# Patient Record
Sex: Female | Born: 1975 | Race: Black or African American | Hispanic: No | Marital: Single | Smoking: Never smoker
Health system: Southern US, Community
[De-identification: ages and names within clinical notes are randomized; demographics above are authoritative.]

## PROBLEM LIST (undated history)

## (undated) DIAGNOSIS — J45909 Unspecified asthma, uncomplicated: Secondary | ICD-10-CM

## (undated) DIAGNOSIS — R079 Chest pain, unspecified: Secondary | ICD-10-CM

## (undated) DIAGNOSIS — R12 Heartburn: Secondary | ICD-10-CM

## (undated) DIAGNOSIS — G43909 Migraine, unspecified, not intractable, without status migrainosus: Secondary | ICD-10-CM

## (undated) DIAGNOSIS — IMO0001 Reserved for inherently not codable concepts without codable children: Secondary | ICD-10-CM

## (undated) HISTORY — PX: BREAST EXCISIONAL BIOPSY: SUR124

## (undated) HISTORY — PX: BREAST LUMPECTOMY: SHX2

---

## 1997-10-19 ENCOUNTER — Other Ambulatory Visit: Admission: RE | Admit: 1997-10-19 | Discharge: 1997-10-19 | Payer: Self-pay | Admitting: Obstetrics

## 1997-12-22 ENCOUNTER — Ambulatory Visit (HOSPITAL_COMMUNITY): Admission: RE | Admit: 1997-12-22 | Discharge: 1997-12-22 | Payer: Self-pay | Admitting: Obstetrics

## 1997-12-24 ENCOUNTER — Ambulatory Visit (HOSPITAL_COMMUNITY): Admission: RE | Admit: 1997-12-24 | Discharge: 1997-12-24 | Payer: Self-pay | Admitting: Obstetrics

## 1998-03-26 ENCOUNTER — Inpatient Hospital Stay (HOSPITAL_COMMUNITY): Admission: AD | Admit: 1998-03-26 | Discharge: 1998-03-26 | Payer: Self-pay | Admitting: Obstetrics

## 1998-04-08 ENCOUNTER — Inpatient Hospital Stay (HOSPITAL_COMMUNITY): Admission: AD | Admit: 1998-04-08 | Discharge: 1998-04-08 | Payer: Self-pay | Admitting: Obstetrics

## 1998-04-12 ENCOUNTER — Inpatient Hospital Stay (HOSPITAL_COMMUNITY): Admission: AD | Admit: 1998-04-12 | Discharge: 1998-04-15 | Payer: Self-pay | Admitting: Obstetrics

## 1998-04-12 ENCOUNTER — Inpatient Hospital Stay (HOSPITAL_COMMUNITY): Admission: AD | Admit: 1998-04-12 | Discharge: 1998-04-13 | Payer: Self-pay | Admitting: Obstetrics

## 1999-07-11 ENCOUNTER — Other Ambulatory Visit: Admission: RE | Admit: 1999-07-11 | Discharge: 1999-07-11 | Payer: Self-pay | Admitting: Obstetrics

## 1999-12-18 ENCOUNTER — Encounter: Payer: Self-pay | Admitting: Emergency Medicine

## 1999-12-18 ENCOUNTER — Emergency Department (HOSPITAL_COMMUNITY): Admission: EM | Admit: 1999-12-18 | Discharge: 1999-12-18 | Payer: Self-pay | Admitting: Emergency Medicine

## 2000-03-01 ENCOUNTER — Other Ambulatory Visit: Admission: RE | Admit: 2000-03-01 | Discharge: 2000-03-01 | Payer: Self-pay | Admitting: Obstetrics

## 2000-05-06 ENCOUNTER — Inpatient Hospital Stay (HOSPITAL_COMMUNITY): Admission: AD | Admit: 2000-05-06 | Discharge: 2000-05-08 | Payer: Self-pay | Admitting: Obstetrics

## 2001-01-22 ENCOUNTER — Emergency Department (HOSPITAL_COMMUNITY): Admission: EM | Admit: 2001-01-22 | Discharge: 2001-01-22 | Payer: Self-pay | Admitting: Emergency Medicine

## 2001-01-22 ENCOUNTER — Encounter: Payer: Self-pay | Admitting: Emergency Medicine

## 2001-04-04 ENCOUNTER — Emergency Department (HOSPITAL_COMMUNITY): Admission: EM | Admit: 2001-04-04 | Discharge: 2001-04-04 | Payer: Self-pay | Admitting: Emergency Medicine

## 2003-12-23 ENCOUNTER — Emergency Department (HOSPITAL_COMMUNITY): Admission: EM | Admit: 2003-12-23 | Discharge: 2003-12-23 | Payer: Self-pay | Admitting: Family Medicine

## 2005-04-12 ENCOUNTER — Emergency Department (HOSPITAL_COMMUNITY): Admission: EM | Admit: 2005-04-12 | Discharge: 2005-04-12 | Payer: Self-pay | Admitting: Emergency Medicine

## 2006-01-09 ENCOUNTER — Emergency Department (HOSPITAL_COMMUNITY): Admission: EM | Admit: 2006-01-09 | Discharge: 2006-01-09 | Payer: Self-pay | Admitting: Family Medicine

## 2006-09-18 ENCOUNTER — Inpatient Hospital Stay (HOSPITAL_COMMUNITY): Admission: AD | Admit: 2006-09-18 | Discharge: 2006-09-20 | Payer: Self-pay | Admitting: Obstetrics

## 2007-12-10 ENCOUNTER — Emergency Department (HOSPITAL_COMMUNITY): Admission: EM | Admit: 2007-12-10 | Discharge: 2007-12-10 | Payer: Self-pay | Admitting: Emergency Medicine

## 2009-03-03 ENCOUNTER — Emergency Department (HOSPITAL_COMMUNITY): Admission: EM | Admit: 2009-03-03 | Discharge: 2009-03-03 | Payer: Self-pay | Admitting: Family Medicine

## 2009-04-03 ENCOUNTER — Ambulatory Visit (HOSPITAL_COMMUNITY): Admission: RE | Admit: 2009-04-03 | Discharge: 2009-04-03 | Payer: Self-pay | Admitting: Orthopedic Surgery

## 2009-04-27 ENCOUNTER — Encounter: Admission: RE | Admit: 2009-04-27 | Discharge: 2009-05-27 | Payer: Self-pay | Admitting: Orthopedic Surgery

## 2010-05-12 LAB — BASIC METABOLIC PANEL
BUN: 8 mg/dL (ref 6–23)
CO2: 29 mEq/L (ref 19–32)
Calcium: 8.7 mg/dL (ref 8.4–10.5)
Chloride: 105 mEq/L (ref 96–112)
Creatinine, Ser: 0.75 mg/dL (ref 0.4–1.2)
GFR calc Af Amer: >60 mL/min (ref >60)
GFR calc non Af Amer: >60 mL/min (ref >60)
Glucose, Bld: 89 mg/dL (ref 70–99)
Potassium: 3.8 mEq/L (ref 3.5–5.1)
Sodium: 139 mEq/L (ref 135–145)

## 2010-05-12 LAB — CBC
HCT: 35.7 % — ABNORMAL LOW (ref 36.0–46.0)
Hemoglobin: 12.4 g/dL (ref 12.0–15.0)
MCHC: 34.8 g/dL (ref 30.0–36.0)
MCV: 90.1 fL (ref 78.0–100.0)
Platelets: 308 10*3/uL (ref 150–400)
RBC: 3.96 MIL/uL (ref 3.87–5.11)
RDW: 11.9 % (ref 11.5–15.5)
WBC: 7.4 10*3/uL (ref 4.0–10.5)

## 2010-07-08 NOTE — Discharge Summary (Signed)
NAMETAELAR, GRONEWOLD                 ACCOUNT NO.:  [id number]   MEDICAL RECORD NO.:  [id number]          PATIENT TYPE:  INP   LOCATION:  9309                          FACILITY:  [facility]   PHYSICIAN:  Kathreen Cosier, M.D.DATE OF BIRTH:  1975-03-24   DATE OF ADMISSION:  09/18/2006  DATE OF DISCHARGE:  09/20/2006                               DISCHARGE SUMMARY   HISTORY OF PRESENT ILLNESS:  The patient is a 35 year old gravida 3,  para 3, last menstrual period 2 weeks prior to admission.  She  complained of left flank pain which became worse over the past 2 weeks.   HOSPITAL COURSE:  On admission her hemoglobin was 12.4, white count  14.9, sodium 133, potassium 3.1, chloride 101, glucose 128.  She had a  positive urine and left flank pain and she was treated with IV  ampicillin, gentamicin by IV.  The day after admission she felt much  better.   DISCHARGE MEDICATIONS:  She became afebrile and by 09/20/2006 she was  discharged home on ampicillin 500, 1 every 6 hours for 10 days and  Motrin 800 t.i.d..   FOLLOW UP:  She is to see me in 2 weeks.   DISCHARGE DIAGNOSIS:  Status post urinary tract infection.           ______________________________  Kathreen Cosier, M.D.     BAM/MEDQ  D:  10/17/2006  T:  10/17/2006  Job:  [id number]

## 2010-07-08 NOTE — Op Note (Signed)
[facility]  Patient:    Erin Morton, Erin Morton                          MRN: [id number] Proc. Date: 05/07/00 Adm. Date:  [id number] Attending:  Venita Sheffield                           Operative Report  PREOPERATIVE DIAGNOSIS:       Multiparity.  POSTOPERATIVE DIAGNOSIS:  OPERATION:                    Postpartum tubal.  SURGEON:                      Kathreen Cosier, M.D.  ASSISTANT:  ANESTHESIA:                   Epidural.  ESTIMATED BLOOD LOSS:  DESCRIPTION OF PROCEDURE:     With the patient in the supine position, the abdomen was prepped and draped and the bladder emptied with a straight catheter.  A midline subumbilical incision was made one inch long and carried down to the fascia.  The fascia was cleaned and grasped with two Kochers.  The fascia and peritoneum were opened with Mayo scissors.  The left tube grasped in the midportion with a Babcock clamp.  A 0 plain suture placed in the mesosalpinx below the portion of the tube within the clamp and this was tied on both sides with approximately one inch of tube transected.  The procedure was done in a similar fashion on the other side.  The abdomen was closed in layers.  The peritoneum and fascia with continuous suture of 0 Dexon and the skin closed with subcuticular stitch of 3-0 plain.  The patient tolerated the procedure well and taken to the recovery room in good condition. DD:  05/07/00 TD:  05/07/00 Job: [id number]

## 2010-12-05 LAB — COMPREHENSIVE METABOLIC PANEL
Albumin: 3.5
BUN: 4 — ABNORMAL LOW
Creatinine, Ser: 0.75
Total Bilirubin: 0.9
Total Protein: 7.1

## 2010-12-05 LAB — CBC
HCT: 35.6 — ABNORMAL LOW
MCV: 87.5
Platelets: 275
RDW: 11.9

## 2010-12-05 LAB — WET PREP, GENITAL
Clue Cells Wet Prep HPF POC: NONE SEEN
Trich, Wet Prep: NONE SEEN

## 2010-12-05 LAB — URINALYSIS, ROUTINE W REFLEX MICROSCOPIC
Nitrite: NEGATIVE
Specific Gravity, Urine: 1.01
Urobilinogen, UA: 1

## 2010-12-05 LAB — GC/CHLAMYDIA PROBE AMP, GENITAL: Chlamydia, DNA Probe: NEGATIVE

## 2010-12-05 LAB — URINE MICROSCOPIC-ADD ON

## 2011-02-15 ENCOUNTER — Encounter: Payer: Self-pay | Admitting: Emergency Medicine

## 2011-02-15 ENCOUNTER — Emergency Department (HOSPITAL_COMMUNITY)
Admission: EM | Admit: 2011-02-15 | Discharge: 2011-02-15 | Disposition: A | Discharge status: Home / Self Care | Payer: Self-pay | Attending: Emergency Medicine | Admitting: Emergency Medicine

## 2011-02-15 DIAGNOSIS — N898 Other specified noninflammatory disorders of vagina: Secondary | ICD-10-CM | POA: Insufficient documentation

## 2011-02-15 DIAGNOSIS — X58XXXA Exposure to other specified factors, initial encounter: Secondary | ICD-10-CM | POA: Insufficient documentation

## 2011-02-15 DIAGNOSIS — S3141XA Laceration without foreign body of vagina and vulva, initial encounter: Secondary | ICD-10-CM

## 2011-02-15 DIAGNOSIS — S3140XA Unspecified open wound of vagina and vulva, initial encounter: Secondary | ICD-10-CM | POA: Insufficient documentation

## 2011-02-15 LAB — WET PREP, GENITAL: Yeast Wet Prep HPF POC: NONE SEEN

## 2011-02-15 MED ORDER — LIDOCAINE 5 % EX OINT: TOPICAL_OINTMENT | CUTANEOUS | Status: AC | PRN

## 2011-02-15 MED ORDER — LIDOCAINE HCL 2 % EX GEL
CUTANEOUS | Status: AC
  Administered 2011-02-15: 15:00:00 via TOPICAL
  Filled 2011-02-15: qty 10

## 2011-02-15 MED ORDER — LIDOCAINE 4 % EX CREA
TOPICAL_CREAM | Freq: Once | CUTANEOUS | Status: DC
  Filled 2011-02-15: qty 5

## 2011-02-15 NOTE — ED Notes (Signed)
Pt. Complaint of vaginal discomfort stated Sunday. With vaginal discharges , denies pain upon urination. Denies lower back pain and nausea and vomiting.

## 2011-02-15 NOTE — ED Provider Notes (Signed)
History     CSN: [id number]  Arrival date & time 02/15/11  1213   First MD Initiated Contact with Patient 02/15/11 1313      No chief complaint on file.   (Consider location/radiation/quality/duration/timing/severity/associated sxs/prior treatment) The history is provided by the patient.   the patient is a 35 year old female that complains of vaginal discomfort.  Since she had intercourse 2 nights ago.  She says it feels like something is torn in her vaginal area.  She denies any bleeding.  She has a slight discharge that she says is not unusual for her.  She has not had fevers, chills, nausea or vomiting.  She has had a history of a sexually transmitted disease for many years ago, and has not had any complications from it.  She states that when she does urinate.  There is some burning when the urine goes over the vaginal area that seems to be performed.  No past medical history on file.  No past surgical history on file.  No family history on file.  History  Substance Use Topics  . Smoking status: Never Smoker   . Smokeless tobacco: Not on file  . Alcohol Use: Yes     occasional    OB History    Grav Para Term Preterm Abortions TAB SAB Ect Mult Living                  Review of Systems  Constitutional: Negative for fever and chills.  HENT: Negative for congestion.   Eyes: Negative for redness.  Cardiovascular: Positive for chest pain.  Gastrointestinal: Negative for abdominal pain.  Genitourinary: Positive for dysuria, vaginal discharge and vaginal pain. Negative for vaginal bleeding.       Slight vaginal discharge, which she says is not unusual for  Musculoskeletal: Negative for back pain.  Skin: Negative for rash.  Neurological: Negative for headaches.  Psychiatric/Behavioral: Negative for confusion.    Allergies  Review of patient's allergies indicates no known allergies.  Home Medications  No current outpatient prescriptions on file.  BP 139/83  Pulse  102  Temp(Src) 98.5 F (36.9 C) (Oral)  Resp 18  SpO2 99%  LMP 02/01/2011  Physical Exam  Constitutional: She is oriented to person, place, and time. She appears well-developed and well-nourished.  HENT:  Head: Normocephalic and atraumatic.  Eyes: Pupils are equal, round, and reactive to light.  Neck: Normal range of motion.  Cardiovascular: Normal rate, regular rhythm and normal heart sounds.   No murmur heard. Pulmonary/Chest: Effort normal and breath sounds normal. No respiratory distress. She has no wheezes. She has no rales.  Abdominal: Soft. She exhibits no distension and no mass. There is no tenderness. There is no rebound and no guarding.  Genitourinary: Uterus normal. Vaginal discharge found.       Pelvic exam performed with female chaperone Approximately 1 cm, vaginal tear between the posterior vagina, and anus.  Very small amount of clear vaginal discharge.  No adnexal tenderness or masses.  No rashes or ulcerations.  Musculoskeletal: Normal range of motion. She exhibits no edema and no tenderness.  Neurological: She is alert and oriented to person, place, and time. No cranial nerve deficit.  Skin: Skin is warm and dry. No rash noted. No erythema.  Psychiatric: She has a normal mood and affect. Her behavior is normal.    ED Course  Procedures (including critical care time)  Labs Reviewed - No data to display No results found.   No diagnosis found.  MDM  Vaginal tear after intercourse No signs std         Nicholes Stairs, MD 02/15/11 1446

## 2011-05-04 ENCOUNTER — Ambulatory Visit: Payer: Self-pay | Admitting: Family Medicine

## 2011-05-09 ENCOUNTER — Ambulatory Visit: Payer: Self-pay | Admitting: Family Medicine

## 2013-05-09 ENCOUNTER — Ambulatory Visit (INDEPENDENT_AMBULATORY_CARE_PROVIDER_SITE_OTHER): Payer: 59 | Admitting: Physician Assistant

## 2013-05-09 VITALS — BP 130/75 | HR 89 | Temp 98.5°F | Resp 18 | Ht 68.0 in | Wt 186.0 lb

## 2013-05-09 DIAGNOSIS — A5901 Trichomonal vulvovaginitis: Secondary | ICD-10-CM

## 2013-05-09 DIAGNOSIS — N898 Other specified noninflammatory disorders of vagina: Secondary | ICD-10-CM

## 2013-05-09 DIAGNOSIS — Z113 Encounter for screening for infections with a predominantly sexual mode of transmission: Secondary | ICD-10-CM

## 2013-05-09 LAB — POCT WET PREP WITH KOH
Clue Cells Wet Prep HPF POC: NEGATIVE
KOH Prep POC: NEGATIVE
Trichomonas, UA: POSITIVE
Yeast Wet Prep HPF POC: NEGATIVE

## 2013-05-09 MED ORDER — METRONIDAZOLE 500 MG PO TABS: 2000.0000 mg | ORAL_TABLET | Freq: Once | ORAL | Status: DC

## 2013-05-09 NOTE — Progress Notes (Signed)
   Subjective:    Patient ID: Erin Morton, female    DOB: February 18, 1976, 38 y.o.   MRN: [id number]  HPI 38 year old female presents for evaluation of vaginal discharge x 3 days.  States symptoms are progressively worsening. Describes as a watery discharge associated with vaginal irritation and pruritis.   Denies abdominal pain, nausea, vomiting, fever, chills, dysuria, urinary frequency, bleeding or spotting.  She is sexually active with 1 female partner. No specific concern about STI's but would like to be tested today.  No personal hx of STI's.  LNMP 05/01/13 Patient is otherwise doing well with no other concerns today.     Review of Systems  Constitutional: Negative for fever and chills.  Gastrointestinal: Negative for nausea, vomiting and abdominal pain.  Genitourinary: Positive for vaginal discharge. Negative for dysuria, frequency, vaginal bleeding and pelvic pain.       Objective:   Physical Exam  Constitutional: She is oriented to person, place, and time. She appears well-developed and well-nourished.  HENT:  Head: Normocephalic and atraumatic.  Right Ear: External ear normal.  Left Ear: External ear normal.  Eyes: Conjunctivae are normal.  Neck: Normal range of motion.  Cardiovascular: Normal rate.   Pulmonary/Chest: Effort normal.  Abdominal: Soft. Bowel sounds are normal. There is no tenderness. There is no rebound and no guarding.  Genitourinary: Uterus normal. Pelvic exam was performed with patient supine. Cervix exhibits discharge (thick, yellow discharge). Cervix exhibits no motion tenderness. Right adnexum displays no mass, no tenderness and no fullness. Left adnexum displays no mass, no tenderness and no fullness. Vaginal discharge (there is thick, yellowish discharge at introitus) found.  Neurological: She is alert and oriented to person, place, and time.  Psychiatric: She has a normal mood and affect. Her behavior is normal. Judgment and thought content normal.       Results for orders placed in visit on 05/09/13  POCT WET PREP WITH KOH      Result Value Ref Range   Trichomonas, UA Positive     Clue Cells Wet Prep HPF POC neg     Epithelial Wet Prep HPF POC 1-3     Yeast Wet Prep HPF POC neg     Bacteria Wet Prep HPF POC 1+     RBC Wet Prep HPF POC 1-2     WBC Wet Prep HPF POC 15-20     KOH Prep POC Negative         Assessment & Plan:  Leukorrhea, not specified as infective - Plan: POCT Wet Prep with KOH  Screening for STD (sexually transmitted disease) - Plan: GC/Chlamydia Probe Amp, HIV antibody, RPR  Trichomonal vaginitis - Plan: metroNIDAZOLE (FLAGYL) 500 MG tablet  Discussed +trichomonas result with patient. Due to length of her relationship I think that it is unlikely both were asymptomatic carriers for 2 years. She will discuss these results with her partner and inform him to be tested as well. GC/CL, HIV, RPR are pending. Treated with Flagyl 2 grams po x 1 dose. Follow up here as needed.

## 2013-05-10 LAB — GC/CHLAMYDIA PROBE AMP
CT Probe RNA: NEGATIVE
GC Probe RNA: NEGATIVE

## 2013-05-10 LAB — RPR

## 2013-05-10 LAB — HIV ANTIBODY (ROUTINE TESTING W REFLEX): HIV: NONREACTIVE

## 2014-03-28 ENCOUNTER — Emergency Department (HOSPITAL_COMMUNITY): Payer: Self-pay

## 2014-03-28 ENCOUNTER — Encounter (HOSPITAL_COMMUNITY): Payer: Self-pay | Admitting: *Deleted

## 2014-03-28 ENCOUNTER — Emergency Department (HOSPITAL_COMMUNITY)
Admission: EM | Admit: 2014-03-28 | Discharge: 2014-03-28 | Disposition: A | Discharge status: Home / Self Care | Payer: Self-pay | Attending: Emergency Medicine | Admitting: Emergency Medicine

## 2014-03-28 DIAGNOSIS — Z3202 Encounter for pregnancy test, result negative: Secondary | ICD-10-CM | POA: Insufficient documentation

## 2014-03-28 DIAGNOSIS — N361 Urethral diverticulum: Secondary | ICD-10-CM | POA: Insufficient documentation

## 2014-03-28 LAB — URINALYSIS, ROUTINE W REFLEX MICROSCOPIC
GLUCOSE, UA: NEGATIVE mg/dL
KETONES UR: 15 mg/dL — AB
Nitrite: NEGATIVE
PH: 5.5 (ref 5.0–8.0)
Protein, ur: 100 mg/dL — AB
Specific Gravity, Urine: 1.025 (ref 1.005–1.030)
UROBILINOGEN UA: 1 mg/dL (ref 0.0–1.0)

## 2014-03-28 LAB — CBC WITH DIFFERENTIAL/PLATELET
Basophils Absolute: 0 10*3/uL (ref 0.0–0.1)
Basophils Relative: 0 % (ref 0–1)
Eosinophils Absolute: 0.2 10*3/uL (ref 0.0–0.7)
Eosinophils Relative: 3 % (ref 0–5)
HEMATOCRIT: 34 % — AB (ref 36.0–46.0)
HEMOGLOBIN: 11.2 g/dL — AB (ref 12.0–15.0)
Lymphocytes Relative: 16 % (ref 12–46)
Lymphs Abs: 1.4 10*3/uL (ref 0.7–4.0)
MCH: 28.3 pg (ref 26.0–34.0)
MCHC: 32.9 g/dL (ref 30.0–36.0)
MCV: 85.9 fL (ref 78.0–100.0)
Monocytes Absolute: 0.6 10*3/uL (ref 0.1–1.0)
Monocytes Relative: 6 % (ref 3–12)
Neutro Abs: 6.9 10*3/uL (ref 1.7–7.7)
Neutrophils Relative %: 75 % (ref 43–77)
Platelets: 360 10*3/uL (ref 150–400)
RBC: 3.96 MIL/uL (ref 3.87–5.11)
RDW: 12.9 % (ref 11.5–15.5)
WBC: 9.1 10*3/uL (ref 4.0–10.5)

## 2014-03-28 LAB — BASIC METABOLIC PANEL
Anion gap: 6 (ref 5–15)
BUN: 13 mg/dL (ref 6–23)
CHLORIDE: 105 mmol/L (ref 96–112)
CO2: 27 mmol/L (ref 19–32)
Calcium: 8.5 mg/dL (ref 8.4–10.5)
Creatinine, Ser: 0.74 mg/dL (ref 0.50–1.10)
GFR calc Af Amer: >90 mL/min (ref >90)
GFR calc non Af Amer: >90 mL/min (ref >90)
GLUCOSE: 99 mg/dL (ref 70–99)
Potassium: 3.8 mmol/L (ref 3.5–5.1)
SODIUM: 138 mmol/L (ref 135–145)

## 2014-03-28 LAB — PREGNANCY, URINE: PREG TEST UR: NEGATIVE

## 2014-03-28 LAB — URINE MICROSCOPIC-ADD ON

## 2014-03-28 MED ORDER — IOHEXOL 300 MG/ML  SOLN
100.0000 mL | Freq: Once | INTRAMUSCULAR | Status: AC | PRN
  Administered 2014-03-28: 100 mL via INTRAVENOUS

## 2014-03-28 NOTE — ED Provider Notes (Signed)
CSN: [id number]     Arrival date & time 03/28/14  1900 History   First MD Initiated Contact with Patient 03/28/14 1938     Chief Complaint  Patient presents with  . Vaginal mass      (Consider location/radiation/quality/duration/timing/severity/associated sxs/prior Treatment) HPI Comments: Patient presents with a vaginal knot. She states she's felt some fullness in her vaginal wall for about a week but today noticed a knot that's tender. She denies any difficulty urinating. She's currently on her period. She denies any abdominal pain. There is no nausea or vomiting. She denies any vaginal discharge other than the bleeding.   History reviewed. No pertinent past medical history. Past Surgical History  Procedure Laterality Date  . Breast lumpectomy      right breast, pt was 39 yo   History reviewed. No pertinent family history. History  Substance Use Topics  . Smoking status: Never Smoker   . Smokeless tobacco: Not on file  . Alcohol Use: Yes     Comment: occasional   OB History    Gravida Para Term Preterm AB TAB SAB Ectopic Multiple Living   3 3 3       3      Review of Systems  Constitutional: Negative for fever, chills, diaphoresis and fatigue.  HENT: Negative for congestion, rhinorrhea and sneezing.   Eyes: Negative.   Respiratory: Negative for cough, chest tightness and shortness of breath.   Cardiovascular: Negative for chest pain and leg swelling.  Gastrointestinal: Negative for nausea, vomiting, abdominal pain, diarrhea and blood in stool.  Genitourinary: Positive for vaginal bleeding and vaginal pain. Negative for frequency, hematuria, flank pain and difficulty urinating.  Musculoskeletal: Negative for back pain and arthralgias.  Skin: Negative for rash.  Neurological: Negative for dizziness, speech difficulty, weakness, numbness and headaches.      Allergies  Review of patient's allergies indicates no known allergies.  Home Medications   Prior to Admission  medications   Medication Sig Start Date End Date Taking? Authorizing Provider  metroNIDAZOLE (FLAGYL) 500 MG tablet Take 4 tablets (2,000 mg total) by mouth once. 05/09/13   Heather M Marte, PA-C   BP 113/65 mmHg  Pulse 79  Temp(Src) 98.9 F (37.2 C) (Oral)  Resp 18  Ht 5\' 7"  (1.702 m)  Wt 180 lb (81.647 kg)  BMI 28.19 kg/m2  SpO2 98%  LMP 03/25/2014 Physical Exam  Constitutional: She is oriented to person, place, and time. She appears well-developed and well-nourished.  HENT:  Head: Normocephalic and atraumatic.  Eyes: Pupils are equal, round, and reactive to light.  Neck: Normal range of motion. Neck supple.  Cardiovascular: Normal rate, regular rhythm and normal heart sounds.   Pulmonary/Chest: Effort normal and breath sounds normal. No respiratory distress. She has no wheezes. She has no rales. She exhibits no tenderness.  Abdominal: Soft. Bowel sounds are normal. There is no tenderness. There is no rebound and no guarding.  Genitourinary:  Patient has vaginal bleeding consistent with her normal menstrual cycle. She has a proximally 3 cm firm mass to her anterior vaginal wall around the urethral area. It is tender on palpation.  Musculoskeletal: Normal range of motion. She exhibits no edema.  Lymphadenopathy:    She has no cervical adenopathy.  Neurological: She is alert and oriented to person, place, and time.  Skin: Skin is warm and dry. No rash noted.  Psychiatric: She has a normal mood and affect.    ED Course  Procedures (including critical care time) Labs Review  Labs Reviewed  URINALYSIS, ROUTINE W REFLEX MICROSCOPIC - Abnormal; Notable for the following:    Color, Urine RED (*)    APPearance TURBID (*)    Hgb urine dipstick LARGE (*)    Bilirubin Urine SMALL (*)    Ketones, ur 15 (*)    Protein, ur 100 (*)    Leukocytes, UA SMALL (*)    All other components within normal limits  CBC WITH DIFFERENTIAL/PLATELET - Abnormal; Notable for the following:     Hemoglobin 11.2 (*)    HCT 34.0 (*)    All other components within normal limits  PREGNANCY, URINE  BASIC METABOLIC PANEL  URINE MICROSCOPIC-ADD ON    Imaging Review Ct Pelvis W Contrast  03/28/2014   CLINICAL DATA:  Vaginal mass.  EXAM: CT PELVIS WITH CONTRAST  TECHNIQUE: Multidetector CT imaging of the pelvis was performed using the standard protocol following the bolus administration of intravenous contrast.  CONTRAST:  163mL OMNIPAQUE IOHEXOL 300 MG/ML  SOLN  COMPARISON:  None.  FINDINGS: 25 x 16 mm rim enhancing cyst is noted in the lower pelvis. Given its location this could be a urethral diverticulum or paravaginal cyst such as a Gartner's duct cyst or Bartholin gland cyst. MRI may be helpful for further evaluation if clinically necessary.  Enlarged fibroid uterus is noted. Both ovaries are normal. The bladder is unremarkable. No pelvic adenopathy. No inguinal adenopathy.  IMPRESSION: 25 x 16 mm cystic structure in the lower pelvis could be a paravaginal cyst (Gartner's duct cyst or Bartholin gland cyst) or a urethral diverticulum.  Enlarged fibroid uterus.   Electronically Signed   By: Kalman Jewels M.D.   On: 03/28/2014 22:02     EKG Interpretation None      MDM   Final diagnoses:  Urethral diverticulum    Pt presents with a mass to her anterior vaginal wall. Given the CT findings, I feel is most consistent with a urethral diverticulum. I don't feel that it's a Bartholin's gland cyst. I discussed close follow-up with OB/GYN. I referred her to the women's outpatient center. I gave her return precautions to return if she has worsening pain fevers difficulty urinating or other worsening symptoms.   Malvin Johns, MD 03/29/14 [id number]

## 2014-03-28 NOTE — Discharge Instructions (Signed)
Return for any worsening pain, swelling, fevers, or difficulty urinating.

## 2014-03-28 NOTE — ED Notes (Signed)
Per pt report: Pt report feeling a "knot" on the internal right side of her vaginal wall.  Pt reports noticed the knot an hour ago but reports having pressure for the past 4 days.  Pt assumed pressure was coming from her menstrual cycle.  Pt reports her period was normal for her.  Pt denies any pain but does report pressure.  Pt reports last intercourse to be 02/20/14.  Pt a/o x 4.  Skin warm and dry. Ambulatory.

## 2014-04-22 ENCOUNTER — Ambulatory Visit (INDEPENDENT_AMBULATORY_CARE_PROVIDER_SITE_OTHER): Payer: Self-pay | Admitting: Obstetrics and Gynecology

## 2014-04-22 ENCOUNTER — Encounter: Payer: Self-pay | Admitting: Obstetrics and Gynecology

## 2014-04-22 VITALS — BP 121/75 | HR 84 | Temp 98.2°F | Ht 67.5 in | Wt 185.1 lb

## 2014-04-22 DIAGNOSIS — Q505 Embryonic cyst of broad ligament: Secondary | ICD-10-CM

## 2014-04-22 DIAGNOSIS — Q524 Other congenital malformations of vagina: Secondary | ICD-10-CM

## 2014-04-22 NOTE — Progress Notes (Signed)
Patient ID: Erin Morton, female   DOB: 1975-09-06, 39 y.o.   MRN: [id number] 39 yo G3P3 presenting today as an ED follow up for the evaluation of an anterior wall vaginal mass. Patient reports noticing a knot in her vagina in early February. She states that it was the size of a golf ball but has since decreased in size. She denies any pain. She denies any urinary symptoms. She reports normal monthly cycles  History reviewed. No pertinent past medical history. Past Surgical History  Procedure Laterality Date  . Breast lumpectomy      right breast, pt was 39 yo   Family History  Problem Relation Age of Onset  . Diabetes Father   . Hypertension Mother    History  Substance Use Topics  . Smoking status: Never Smoker   . Smokeless tobacco: Never Used  . Alcohol Use: Yes     Comment: occasional   GENERAL: Well-developed, well-nourished female in no acute distress.  ABDOMEN: Soft, nontender, nondistended. No organomegaly. PELVIC: Normal external female genitalia. Vagina is pink and rugated. Very small cystic nodule palpated under the urethra less than 1 cm, non tender. Normal discharge. Normal appearing cervix.  EXTREMITIES: No cyanosis, clubbing, or edema, 2+ distal pulses.   03/28/2014 CT pelvis FINDINGS: 25 x 16 mm rim enhancing cyst is noted in the lower pelvis. Given its location this could be a urethral diverticulum or paravaginal cyst such as a Gartner's duct cyst or Bartholin gland cyst. MRI may be helpful for further evaluation if clinically necessary.  Enlarged fibroid uterus is noted. Both ovaries are normal. The bladder is unremarkable. No pelvic adenopathy. No inguinal adenopathy.  IMPRESSION: 25 x 16 mm cystic structure in the lower pelvis could be a paravaginal cyst (Gartner's duct cyst or Bartholin gland cyst) or a urethral diverticulum.  Enlarged fibroid uterus.   Electronically Signed  By: Kalman Jewels M.D.  On: 03/28/2014 22:02  A/P 39 yo with an  asymptomatic Gartner's cyst - Benign vaginal lesion - Discussed that since patient is asymptomatic and cyst is so small, no need for surgical intervention at this time - RTC for annual exam

## 2014-09-21 ENCOUNTER — Other Ambulatory Visit: Payer: Self-pay

## 2014-09-21 ENCOUNTER — Encounter (HOSPITAL_COMMUNITY): Payer: Self-pay

## 2014-09-21 ENCOUNTER — Encounter (HOSPITAL_COMMUNITY)
Admission: RE | Admit: 2014-09-21 | Discharge: 2014-09-21 | Disposition: A | Discharge status: Home / Self Care | Payer: 59 | Source: Ambulatory Visit | Attending: Obstetrics and Gynecology | Admitting: Obstetrics and Gynecology

## 2014-09-21 DIAGNOSIS — Z01812 Encounter for preprocedural laboratory examination: Secondary | ICD-10-CM | POA: Insufficient documentation

## 2014-09-21 DIAGNOSIS — Z01810 Encounter for preprocedural cardiovascular examination: Secondary | ICD-10-CM | POA: Diagnosis not present

## 2014-09-21 DIAGNOSIS — D259 Leiomyoma of uterus, unspecified: Secondary | ICD-10-CM | POA: Diagnosis not present

## 2014-09-21 HISTORY — DX: Chest pain, unspecified: R07.9

## 2014-09-21 HISTORY — DX: Reserved for inherently not codable concepts without codable children: IMO0001

## 2014-09-21 HISTORY — DX: Migraine, unspecified, not intractable, without status migrainosus: G43.909

## 2014-09-21 HISTORY — DX: Heartburn: R12

## 2014-09-21 LAB — COMPREHENSIVE METABOLIC PANEL
ALBUMIN: 4 g/dL (ref 3.5–5.0)
ALT: 21 U/L (ref 14–54)
AST: 19 U/L (ref 15–41)
Alkaline Phosphatase: 73 U/L (ref 38–126)
Anion gap: 6 (ref 5–15)
BUN: 13 mg/dL (ref 6–20)
CALCIUM: 8.2 mg/dL — AB (ref 8.9–10.3)
CHLORIDE: 106 mmol/L (ref 101–111)
CO2: 25 mmol/L (ref 22–32)
Creatinine, Ser: 0.72 mg/dL (ref 0.44–1.00)
GFR calc Af Amer: >60 mL/min (ref >60)
GFR calc non Af Amer: >60 mL/min (ref >60)
Glucose, Bld: 104 mg/dL — ABNORMAL HIGH (ref 65–99)
Potassium: 3.8 mmol/L (ref 3.5–5.1)
SODIUM: 137 mmol/L (ref 135–145)
TOTAL PROTEIN: 7.8 g/dL (ref 6.5–8.1)
Total Bilirubin: 0.6 mg/dL (ref 0.3–1.2)

## 2014-09-21 LAB — CBC
HEMATOCRIT: 33.4 % — AB (ref 36.0–46.0)
Hemoglobin: 10.6 g/dL — ABNORMAL LOW (ref 12.0–15.0)
MCH: 26.8 pg (ref 26.0–34.0)
MCHC: 31.7 g/dL (ref 30.0–36.0)
MCV: 84.6 fL (ref 78.0–100.0)
PLATELETS: 346 10*3/uL (ref 150–400)
RBC: 3.95 MIL/uL (ref 3.87–5.11)
RDW: 14.3 % (ref 11.5–15.5)
WBC: 6.2 10*3/uL (ref 4.0–10.5)

## 2014-09-21 LAB — ABO/RH: ABO/RH(D): A POS

## 2014-09-21 NOTE — Patient Instructions (Signed)
Your procedure is scheduled on:09/29/14  Enter through the Main Entrance at :[place] up desk phone and dial [phone] and inform us of your arrival.  Please call [phone] if you have any problems the morning of surgery.  Remember: Do not eat food or drink liquids, including water, after midnight:Monday   You may brush your teeth the morning of surgery.   DO NOT wear jewelry, eye make-up, lipstick,body lotion, or dark fingernail polish.  (Polished toes are ok) You may wear deodorant.  If you are to be admitted after surgery, leave suitcase in car until your room has been assigned. Patients discharged on the day of surgery will not be allowed to drive home. Wear loose fitting, comfortable clothes for your ride home.

## 2014-09-21 NOTE — H&P (Addendum)
Erin Morton is an 39 y.o. female G3P3 with fibroids presents for surgical mngt    Menstrual History:  No LMP recorded.    Past Medical History  Diagnosis Date  . Migraines   . Chest pain     recent episodes of chest pain after walking  . Shortness of breath dyspnea     on exertion  . Heartburn     Past Surgical History  Procedure Laterality Date  . Breast lumpectomy      right breast, pt was 39 yo    Family History  Problem Relation Age of Onset  . Diabetes Father   . Hypertension Mother     Social History:  reports that she has never smoked. She has never used smokeless tobacco. She reports that she drinks alcohol. She reports that she does not use illicit drugs.  Allergies: No Known Allergies  No prescriptions prior to admission    ROS  AF, VSS  BP 118/68  Physical Exam  Gen - NAD ABd - soft, NT CV - RRR Lungs - clear PV - enlarged, NT uterus  Results for orders placed or performed during the hospital encounter of 09/21/14 (from the past 24 hour(s))  CBC     Status: Abnormal   Collection Time: 09/21/14 12:00 PM  Result Value Ref Range   WBC 6.2 4.0 - 10.5 K/uL   RBC 3.95 3.87 - 5.11 MIL/uL   Hemoglobin 10.6 (L) 12.0 - 15.0 g/dL   HCT 33.4 (L) 36.0 - 46.0 %   MCV 84.6 78.0 - 100.0 fL   MCH 26.8 26.0 - 34.0 pg   MCHC 31.7 30.0 - 36.0 g/dL   RDW 14.3 11.5 - 15.5 %   Platelets 346 150 - 400 K/uL  Comprehensive metabolic panel     Status: Abnormal   Collection Time: 09/21/14 12:00 PM  Result Value Ref Range   Sodium 137 135 - 145 mmol/L   Potassium 3.8 3.5 - 5.1 mmol/L   Chloride 106 101 - 111 mmol/L   CO2 25 22 - 32 mmol/L   Glucose, Bld 104 (H) 65 - 99 mg/dL   BUN 13 6 - 20 mg/dL   Creatinine, Ser 0.72 0.44 - 1.00 mg/dL   Calcium 8.2 (L) 8.9 - 10.3 mg/dL   Total Protein 7.8 6.5 - 8.1 g/dL   Albumin 4.0 3.5 - 5.0 g/dL   AST 19 15 - 41 U/L   ALT 21 14 - 54 U/L   Alkaline Phosphatase 73 38 - 126 U/L   Total Bilirubin 0.6 0.3 - 1.2 mg/dL   GFR calc non Af Amer >60 >60 mL/min   GFR calc Af Amer >60 >60 mL/min   Anion gap 6 5 - 15    PV [place]:  Multiple fibroids, larges 6.6cm, also 5.5, 3.8cm.  No adnexal masses or free fluid  Assessment/Plan: Fibroid, menorrhagia TAH R/b/a discussed, questions answered, informed consent  Erin Morton 09/21/2014, 2:24 PM

## 2014-09-22 LAB — TYPE AND SCREEN
ABO/RH(D): A POS
ANTIBODY SCREEN: NEGATIVE

## 2014-09-24 NOTE — Anesthesia Preprocedure Evaluation (Addendum)
Anesthesia Evaluation  Patient identified by MRN, date of birth, ID band Patient awake    Reviewed: Allergy & Precautions, NPO status , Patient's Chart, lab work & pertinent test results, reviewed documented beta blocker date and time   Airway Mallampati: II    Positive for:  Tracheal deviation   Dental  (+) Teeth Intact, Dental Advisory Given   Pulmonary  breath sounds clear to auscultation        Cardiovascular negative cardio ROS  Rhythm:Regular  EKG WNL   Neuro/Psych  Headaches,    GI/Hepatic negative GI ROS, Neg liver ROS,   Endo/Other    Renal/GU negative Renal ROS   Fibroids    Musculoskeletal   Abdominal (+)  Abdomen: soft.    Peds  Hematology 11/33   Anesthesia Other Findings   Reproductive/Obstetrics                            Anesthesia Physical Anesthesia Plan  ASA: II  Anesthesia Plan: General   Post-op Pain Management:    Induction: Intravenous  Airway Management Planned: Oral ETT  Additional Equipment:   Intra-op Plan:   Post-operative Plan: Extubation in OR  Informed Consent: I have reviewed the patients History and Physical, chart, labs and discussed the procedure including the risks, benefits and alternatives for the proposed anesthesia with the patient or authorized representative who has indicated his/her understanding and acceptance.     Plan Discussed with:   Anesthesia Plan Comments:         Anesthesia Quick Evaluation

## 2014-09-28 MED ORDER — DEXTROSE 5 % IV SOLN
2.0000 g | INTRAVENOUS | Status: AC
  Administered 2014-09-29: 2 g via INTRAVENOUS
  Filled 2014-09-28: qty 2

## 2014-09-29 ENCOUNTER — Inpatient Hospital Stay (HOSPITAL_COMMUNITY)
Admission: RE | Admit: 2014-09-29 | Discharge: 2014-10-01 | DRG: 743 | Disposition: A | Discharge status: Home / Self Care | Payer: 59 | Source: Ambulatory Visit | Attending: Obstetrics and Gynecology | Admitting: Obstetrics and Gynecology

## 2014-09-29 ENCOUNTER — Encounter (HOSPITAL_COMMUNITY)
Admission: RE | Disposition: A | Discharge status: Home / Self Care | Payer: Self-pay | Source: Ambulatory Visit | Attending: Obstetrics and Gynecology

## 2014-09-29 ENCOUNTER — Inpatient Hospital Stay (HOSPITAL_COMMUNITY): Payer: 59 | Admitting: Anesthesiology

## 2014-09-29 ENCOUNTER — Encounter (HOSPITAL_COMMUNITY): Payer: Self-pay

## 2014-09-29 DIAGNOSIS — R112 Nausea with vomiting, unspecified: Secondary | ICD-10-CM | POA: Diagnosis not present

## 2014-09-29 DIAGNOSIS — N920 Excessive and frequent menstruation with regular cycle: Secondary | ICD-10-CM | POA: Diagnosis present

## 2014-09-29 DIAGNOSIS — D219 Benign neoplasm of connective and other soft tissue, unspecified: Secondary | ICD-10-CM | POA: Diagnosis present

## 2014-09-29 DIAGNOSIS — J398 Other specified diseases of upper respiratory tract: Secondary | ICD-10-CM | POA: Diagnosis present

## 2014-09-29 DIAGNOSIS — D259 Leiomyoma of uterus, unspecified: Secondary | ICD-10-CM | POA: Diagnosis present

## 2014-09-29 HISTORY — PX: ABDOMINAL HYSTERECTOMY: SHX81

## 2014-09-29 LAB — PREGNANCY, URINE: Preg Test, Ur: NEGATIVE

## 2014-09-29 SURGERY — HYSTERECTOMY, ABDOMINAL
Anesthesia: General | Site: Abdomen

## 2014-09-29 MED ORDER — PHENYLEPHRINE HCL 10 MG/ML IJ SOLN
INTRAMUSCULAR | Status: DC | PRN
  Administered 2014-09-29: 80 ug via INTRAVENOUS

## 2014-09-29 MED ORDER — SCOPOLAMINE 1 MG/3DAYS TD PT72
MEDICATED_PATCH | TRANSDERMAL | Status: AC
  Administered 2014-09-29: 1.5 mg via TRANSDERMAL
  Filled 2014-09-29: qty 1

## 2014-09-29 MED ORDER — FENTANYL CITRATE (PF) 250 MCG/5ML IJ SOLN
INTRAMUSCULAR | Status: AC
  Filled 2014-09-29: qty 25

## 2014-09-29 MED ORDER — ONDANSETRON HCL 4 MG/2ML IJ SOLN
INTRAMUSCULAR | Status: AC
  Filled 2014-09-29: qty 2

## 2014-09-29 MED ORDER — LIDOCAINE HCL (CARDIAC) 20 MG/ML IV SOLN
INTRAVENOUS | Status: DC | PRN
  Administered 2014-09-29: 100 mg via INTRAVENOUS

## 2014-09-29 MED ORDER — LACTATED RINGERS IV SOLN
INTRAVENOUS | Status: DC
  Administered 2014-09-29 (×4): via INTRAVENOUS

## 2014-09-29 MED ORDER — HYDROMORPHONE HCL 1 MG/ML IJ SOLN
1.0000 mg | Freq: Once | INTRAMUSCULAR | Status: AC
  Administered 2014-09-29: 1 mg via INTRAVENOUS
  Filled 2014-09-29: qty 1

## 2014-09-29 MED ORDER — SODIUM CHLORIDE 0.9 % IJ SOLN: 9.0000 mL | INTRAMUSCULAR | Status: DC | PRN

## 2014-09-29 MED ORDER — FENTANYL CITRATE (PF) 100 MCG/2ML IJ SOLN
INTRAMUSCULAR | Status: AC
  Filled 2014-09-29: qty 4

## 2014-09-29 MED ORDER — LACTATED RINGERS IV BOLUS (SEPSIS)
500.0000 mL | Freq: Once | INTRAVENOUS | Status: AC
  Administered 2014-09-29: 500 mL via INTRAVENOUS

## 2014-09-29 MED ORDER — ROCURONIUM BROMIDE 100 MG/10ML IV SOLN
INTRAVENOUS | Status: AC
  Filled 2014-09-29: qty 1

## 2014-09-29 MED ORDER — ONDANSETRON HCL 4 MG/2ML IJ SOLN
4.0000 mg | Freq: Four times a day (QID) | INTRAMUSCULAR | Status: DC | PRN
  Administered 2014-09-29 (×2): 4 mg via INTRAVENOUS
  Filled 2014-09-29: qty 2

## 2014-09-29 MED ORDER — SCOPOLAMINE 1 MG/3DAYS TD PT72
1.0000 | MEDICATED_PATCH | Freq: Once | TRANSDERMAL | Status: DC
  Administered 2014-09-29: 1.5 mg via TRANSDERMAL

## 2014-09-29 MED ORDER — HYDROMORPHONE HCL 1 MG/ML IJ SOLN
1.0000 mg | INTRAMUSCULAR | Status: DC | PRN
  Administered 2014-09-29 (×2): 1 mg via INTRAVENOUS
  Filled 2014-09-29: qty 1

## 2014-09-29 MED ORDER — DIPHENHYDRAMINE HCL 50 MG/ML IJ SOLN: 12.5000 mg | Freq: Four times a day (QID) | INTRAMUSCULAR | Status: DC | PRN

## 2014-09-29 MED ORDER — NALOXONE HCL 0.4 MG/ML IJ SOLN: 0.4000 mg | INTRAMUSCULAR | Status: DC | PRN

## 2014-09-29 MED ORDER — MENTHOL 3 MG MT LOZG: 1.0000 | LOZENGE | OROMUCOSAL | Status: DC | PRN

## 2014-09-29 MED ORDER — DEXAMETHASONE SODIUM PHOSPHATE 10 MG/ML IJ SOLN
INTRAMUSCULAR | Status: DC | PRN
  Administered 2014-09-29: 4 mg via INTRAVENOUS

## 2014-09-29 MED ORDER — PROPOFOL 10 MG/ML IV BOLUS
INTRAVENOUS | Status: AC
  Filled 2014-09-29: qty 20

## 2014-09-29 MED ORDER — MIDAZOLAM HCL 2 MG/2ML IJ SOLN
INTRAMUSCULAR | Status: AC
  Filled 2014-09-29: qty 4

## 2014-09-29 MED ORDER — MORPHINE SULFATE 1 MG/ML IV SOLN
INTRAVENOUS | Status: DC
  Administered 2014-09-29: 22:00:00 via INTRAVENOUS
  Administered 2014-09-30: 4 mg via INTRAVENOUS
  Administered 2014-09-30: 3 mg via INTRAVENOUS
  Administered 2014-09-30: 6 mg via INTRAVENOUS
  Filled 2014-09-29: qty 25

## 2014-09-29 MED ORDER — LACTATED RINGERS IV SOLN
INTRAVENOUS | Status: DC
  Administered 2014-09-29 – 2014-09-30 (×2): via INTRAVENOUS

## 2014-09-29 MED ORDER — MIDAZOLAM HCL 2 MG/2ML IJ SOLN
INTRAMUSCULAR | Status: DC | PRN
  Administered 2014-09-29: 2 mg via INTRAVENOUS

## 2014-09-29 MED ORDER — ONDANSETRON HCL 4 MG/2ML IJ SOLN
INTRAMUSCULAR | Status: DC | PRN
Start: 2014-09-29 — End: 2014-09-29
  Administered 2014-09-29: 4 mg via INTRAVENOUS

## 2014-09-29 MED ORDER — KETOROLAC TROMETHAMINE 30 MG/ML IJ SOLN
INTRAMUSCULAR | Status: DC | PRN
  Administered 2014-09-29: 30 mg via INTRAVENOUS

## 2014-09-29 MED ORDER — LIDOCAINE HCL (CARDIAC) 20 MG/ML IV SOLN
INTRAVENOUS | Status: AC
  Filled 2014-09-29: qty 5

## 2014-09-29 MED ORDER — ONDANSETRON HCL 4 MG PO TABS: 4.0000 mg | ORAL_TABLET | Freq: Four times a day (QID) | ORAL | Status: DC | PRN

## 2014-09-29 MED ORDER — ONDANSETRON HCL 4 MG/2ML IJ SOLN: 4.0000 mg | Freq: Four times a day (QID) | INTRAMUSCULAR | Status: DC | PRN

## 2014-09-29 MED ORDER — PROPOFOL 10 MG/ML IV BOLUS
INTRAVENOUS | Status: DC | PRN
  Administered 2014-09-29: 200 mg via INTRAVENOUS

## 2014-09-29 MED ORDER — MEPERIDINE HCL 25 MG/ML IJ SOLN: 6.2500 mg | INTRAMUSCULAR | Status: DC | PRN

## 2014-09-29 MED ORDER — HYDROMORPHONE HCL 1 MG/ML IJ SOLN
INTRAMUSCULAR | Status: AC
  Filled 2014-09-29: qty 1

## 2014-09-29 MED ORDER — HYDROMORPHONE HCL 1 MG/ML IJ SOLN
1.0000 mg | INTRAMUSCULAR | Status: DC | PRN
  Administered 2014-09-29: 1 mg via INTRAVENOUS
  Filled 2014-09-29: qty 1

## 2014-09-29 MED ORDER — PROMETHAZINE HCL 25 MG/ML IJ SOLN
12.5000 mg | Freq: Four times a day (QID) | INTRAMUSCULAR | Status: DC | PRN
  Administered 2014-09-29: 12.5 mg via INTRAVENOUS
  Filled 2014-09-29: qty 1

## 2014-09-29 MED ORDER — PROMETHAZINE HCL 25 MG/ML IJ SOLN: 6.2500 mg | INTRAMUSCULAR | Status: DC | PRN

## 2014-09-29 MED ORDER — DIPHENHYDRAMINE HCL 12.5 MG/5ML PO ELIX: 12.5000 mg | ORAL_SOLUTION | Freq: Four times a day (QID) | ORAL | Status: DC | PRN

## 2014-09-29 MED ORDER — OXYCODONE-ACETAMINOPHEN 5-325 MG PO TABS
1.0000 | ORAL_TABLET | ORAL | Status: DC | PRN
  Administered 2014-09-30: 2 via ORAL
  Administered 2014-09-30 (×2): 1 via ORAL
  Administered 2014-09-30 – 2014-10-01 (×4): 2 via ORAL
  Filled 2014-09-29: qty 1
  Filled 2014-09-29 (×3): qty 2
  Filled 2014-09-29: qty 1
  Filled 2014-09-29 (×2): qty 2

## 2014-09-29 MED ORDER — GLYCOPYRROLATE 0.2 MG/ML IJ SOLN
INTRAMUSCULAR | Status: DC | PRN
  Administered 2014-09-29: 0.6 mg via INTRAVENOUS

## 2014-09-29 MED ORDER — KETOROLAC TROMETHAMINE 30 MG/ML IJ SOLN
30.0000 mg | Freq: Four times a day (QID) | INTRAMUSCULAR | Status: AC
  Administered 2014-09-29 (×2): 30 mg via INTRAVENOUS
  Filled 2014-09-29 (×2): qty 1

## 2014-09-29 MED ORDER — NEOSTIGMINE METHYLSULFATE 10 MG/10ML IV SOLN
INTRAVENOUS | Status: DC | PRN
  Administered 2014-09-29: 4 mg via INTRAVENOUS

## 2014-09-29 MED ORDER — ROCURONIUM BROMIDE 100 MG/10ML IV SOLN
INTRAVENOUS | Status: DC | PRN
  Administered 2014-09-29: 50 mg via INTRAVENOUS
  Administered 2014-09-29: 10 mg via INTRAVENOUS

## 2014-09-29 MED ORDER — HYDROMORPHONE HCL 1 MG/ML IJ SOLN
0.2500 mg | INTRAMUSCULAR | Status: DC | PRN
  Administered 2014-09-29 (×4): 0.5 mg via INTRAVENOUS

## 2014-09-29 MED ORDER — DEXAMETHASONE SODIUM PHOSPHATE 4 MG/ML IJ SOLN
INTRAMUSCULAR | Status: AC
  Filled 2014-09-29: qty 1

## 2014-09-29 MED ORDER — FENTANYL CITRATE (PF) 100 MCG/2ML IJ SOLN
INTRAMUSCULAR | Status: DC | PRN
  Administered 2014-09-29: 100 ug via INTRAVENOUS
  Administered 2014-09-29: 50 ug via INTRAVENOUS
  Administered 2014-09-29 (×2): 100 ug via INTRAVENOUS

## 2014-09-29 SURGICAL SUPPLY — 33 items
CANISTER SUCT 3000ML (MISCELLANEOUS) ×2 IMPLANT
CLOTH BEACON ORANGE TIMEOUT ST (SAFETY) ×2 IMPLANT
CONT PATH 16OZ SNAP LID 3702 (MISCELLANEOUS) ×2 IMPLANT
DECANTER SPIKE VIAL GLASS SM (MISCELLANEOUS) IMPLANT
DRAPE CESAREAN BIRTH W POUCH (DRAPES) ×2 IMPLANT
DRAPE WARM FLUID 44X44 (DRAPE) ×2 IMPLANT
DRSG OPSITE POSTOP 4X10 (GAUZE/BANDAGES/DRESSINGS) ×2 IMPLANT
DURAPREP 26ML APPLICATOR (WOUND CARE) ×2 IMPLANT
GAUZE SPONGE 4X4 16PLY XRAY LF (GAUZE/BANDAGES/DRESSINGS) IMPLANT
GLOVE BIO SURGEON STRL SZ 6.5 (GLOVE) ×2 IMPLANT
GLOVE BIOGEL PI IND STRL 7.0 (GLOVE) ×3 IMPLANT
GLOVE BIOGEL PI INDICATOR 7.0 (GLOVE) ×3
GOWN STRL REUS W/TWL LRG LVL3 (GOWN DISPOSABLE) ×6 IMPLANT
HEMOSTAT SURGICEL 4X8 (HEMOSTASIS) IMPLANT
LIQUID BAND (GAUZE/BANDAGES/DRESSINGS) ×2 IMPLANT
NS IRRIG 1000ML POUR BTL (IV SOLUTION) ×2 IMPLANT
PACK ABDOMINAL GYN (CUSTOM PROCEDURE TRAY) ×2 IMPLANT
PAD OB MATERNITY 4.3X12.25 (PERSONAL CARE ITEMS) ×2 IMPLANT
PROTECTOR NERVE ULNAR (MISCELLANEOUS) ×2 IMPLANT
SPONGE LAP 18X18 X RAY DECT (DISPOSABLE) ×6 IMPLANT
STAPLER VISISTAT 35W (STAPLE) IMPLANT
SUT MNCRL 0 MO-4 VIOLET 18 CR (SUTURE) ×4 IMPLANT
SUT MNCRL 0 VIOLET 6X18 (SUTURE) ×1 IMPLANT
SUT MON AB-0 CT1 36 (SUTURE) ×2 IMPLANT
SUT MONOCRYL 0 6X18 (SUTURE) ×1
SUT MONOCRYL 0 MO 4 18  CR/8 (SUTURE) ×4
SUT PDS AB 0 CTX 60 (SUTURE) ×2 IMPLANT
SUT PLAIN 2 0 XLH (SUTURE) IMPLANT
SUT VIC AB 3-0 X1 27 (SUTURE) IMPLANT
SUT VIC AB 4-0 KS 27 (SUTURE) ×2 IMPLANT
TOWEL OR 17X24 6PK STRL BLUE (TOWEL DISPOSABLE) ×4 IMPLANT
TRAY FOLEY CATH SILVER 14FR (SET/KITS/TRAYS/PACK) ×2 IMPLANT
WATER STERILE IRR 1000ML POUR (IV SOLUTION) ×2 IMPLANT

## 2014-09-29 NOTE — Anesthesia Procedure Notes (Signed)
Procedure Name: Intubation Date/Time: 09/29/2014 7:31 AM Performed by: Lliam Hoh, Sheron Nightingale Pre-anesthesia Checklist: Patient identified, Patient being monitored, Emergency Drugs available, Timeout performed and Suction available Patient Re-evaluated:Patient Re-evaluated prior to inductionOxygen Delivery Method: Circle system utilized Preoxygenation: Pre-oxygenation with 100% oxygen Intubation Type: IV induction Ventilation: Mask ventilation without difficulty Laryngoscope Size: Mac and 3 Grade View: Grade I Tube type: Oral Tube size: 7.0 mm Number of attempts: 1 Dental Injury: Teeth and Oropharynx as per pre-operative assessment

## 2014-09-29 NOTE — Progress Notes (Signed)
Day of Surgery Procedure(s) (LRB): HYSTERECTOMY ABDOMINAL (N/A)  Subjective: Patient reports nausea and incisional pain.    Objective: I have reviewed patient's vital signs, intake and output, medications and labs.  General: alert and cooperative GI: normal findings: appropriately tender, ND Extremities: extremities normal, atraumatic, no cyanosis or edema Vaginal Bleeding: none  Assessment: s/p Procedure(s): HYSTERECTOMY ABDOMINAL (N/A): stable  Plan: Advance diet Advance to PO medication zofran prn for nausea  IV toradol   LOS: 0 days    Erin Morton 09/29/2014, 1:38 PM

## 2014-09-29 NOTE — Op Note (Signed)
NAMELILLIA, Erin Morton                 ACCOUNT NO.:  [id number]  MEDICAL RECORD NO.:  [id number]  LOCATION:  9302                          FACILITY:  [facility]  PHYSICIAN:  Marylynn Pearson, MD    DATE OF BIRTH:  1975-05-23  DATE OF PROCEDURE: DATE OF DISCHARGE:                              OPERATIVE REPORT   PREOPERATIVE DIAGNOSES: 1. Fibroids. 2. Menorrhagia.  POSTOPERATIVE DIAGNOSES: 1. Fibroids. 2. Menorrhagia.  PROCEDURE:  Total abdominal hysterectomy.  SURGEON:  Marylynn Pearson, M.D.  ANESTHESIA:  General.  SURGICAL ASSISTANT:  Arvella Nigh, M.D..  SPECIMEN:  Uterus with cervix.  ESTIMATED BLOOD LOSS:  200 mL.  COMPLICATIONS:  None.  CONDITION:  Stable and extubated to recovery room.  DESCRIPTION OF PROCEDURE:  The patient was taken to the operating room. After informed consent was obtained, she was given general anesthesia. Placed in the supine position.  She was prepped and draped in sterile fashion.  A Foley catheter was inserted.  Pfannenstiel skin incision was made with a scalpel and extended sharply to the level of the fascia. The fascia was incised in the midline and extended laterally using curved Mayo scissors.  Kocher clamps were used to lift the fascia and the underlying rectus muscles were dissected clear using sharp and blunt dissection.  Peritoneum was identified and entered sharply with Metzenbaum scissors.  This was extended with good visualization.  The uterus was then delivered through the uterine incision.  Bilateral round ligaments were grasped, suture ligated, and cut.  The flap was then dissected free from the lower uterine segment and cervix using sharp and blunt dissection.  The utero-ovarian ligament and fallopian tubes were then doubly clamped, cut, and doubly suture ligated.  Excellent hemostasis was noted.  The broad ligament was then dissected down to the uterine arteries.  Uterine arteries were clamped, cut, and suture ligated bilaterally.   Cardinal ligament and uterosacral ligaments were then clamped, cut, and suture ligated bilaterally.  Hemostasis was assured.  Uterus and cervix were then amputated.  The vaginal cuff was then closed using a series of figure-of-eight suture.  Once the cuff was closed, the pelvis was irrigated with saline.  The pedicles were all re- inspected and again found to be hemostatic.  The peritoneum was reapproximated with Monocryl.  The fascia was closed with PDS and the skin was closed with Vicryl.  Skin glue was placed over the incision.  The patient was extubated and taken to the recovery room in stable condition.  Sponge, lap, needle, and instrument counts were correct x2.     Marylynn Pearson, MD     GA/MEDQ  D:  09/29/2014  T:  09/29/2014  Job:  [id number]

## 2014-09-29 NOTE — Anesthesia Postprocedure Evaluation (Signed)
  Anesthesia Post-op Note  Patient: Erin Morton  Procedure(s) Performed: Procedure(s): HYSTERECTOMY ABDOMINAL (N/A)  Patient Location: [facility]  Anesthesia Type:General  Level of Consciousness: awake, alert , oriented and patient cooperative  Airway and Oxygen Therapy: Patient Spontanous Breathing  Post-op Pain: none  Post-op Assessment: Post-op Vital signs reviewed, Patient's Cardiovascular Status Stable, Respiratory Function Stable, Patent Airway and No signs of Nausea or vomiting              Post-op Vital Signs: Reviewed and stable  Last Vitals:  Filed Vitals:   09/29/14 1115  BP: 118/74  Pulse: 62  Temp: 36.8 C  Resp: 16    Complications: No apparent anesthesia complications

## 2014-09-29 NOTE — Anesthesia Postprocedure Evaluation (Signed)
  Anesthesia Post-op Note  Patient: Erin Morton  Procedure(s) Performed: Procedure(s): HYSTERECTOMY ABDOMINAL (N/A)  Patient Location: PACU  Anesthesia Type:General  Level of Consciousness: awake  Airway and Oxygen Therapy: Patient Spontanous Breathing  Post-op Pain: mild  Post-op Assessment: Post-op Vital signs reviewed, Patient's Cardiovascular Status Stable, Respiratory Function Stable, Patent Airway and No signs of Nausea or vomiting              Post-op Vital Signs: Reviewed  Last Vitals:  Filed Vitals:   09/29/14 0845  BP:   Pulse:   Temp: 37.1 C  Resp:     Complications: No apparent anesthesia complications

## 2014-09-29 NOTE — Transfer of Care (Signed)
Immediate Anesthesia Transfer of Care Note  Patient: MAEVA DANT  Procedure(s) Performed: Procedure(s): HYSTERECTOMY ABDOMINAL (N/A)  Patient Location: PACU  Anesthesia Type:General  Level of Consciousness: awake, alert  and oriented  Airway & Oxygen Therapy: Patient Spontanous Breathing and Patient connected to nasal cannula oxygen  Post-op Assessment: Report given to RN and Post -op Vital signs reviewed and stable  Post vital signs: Reviewed and stable  Last Vitals:  Filed Vitals:   09/29/14 0559  BP: 120/80  Pulse: 77  Temp: 36.9 C  Resp: 20    Complications: No apparent anesthesia complications

## 2014-09-29 NOTE — Addendum Note (Signed)
Addendum  created 09/29/14 1340 by Raenette Rover, CRNA   Modules edited: Notes Section   Notes Section:  File: [id number]

## 2014-09-30 ENCOUNTER — Encounter (HOSPITAL_COMMUNITY): Payer: Self-pay | Admitting: Obstetrics and Gynecology

## 2014-09-30 LAB — COMPREHENSIVE METABOLIC PANEL
ALBUMIN: 3.1 g/dL — AB (ref 3.5–5.0)
ALT: 15 U/L (ref 14–54)
AST: 15 U/L (ref 15–41)
Alkaline Phosphatase: 56 U/L (ref 38–126)
Anion gap: 5 (ref 5–15)
BUN: 8 mg/dL (ref 6–20)
CALCIUM: 7.8 mg/dL — AB (ref 8.9–10.3)
CO2: 27 mmol/L (ref 22–32)
CREATININE: 0.77 mg/dL (ref 0.44–1.00)
Chloride: 103 mmol/L (ref 101–111)
GFR calc Af Amer: >60 mL/min (ref >60)
GFR calc non Af Amer: >60 mL/min (ref >60)
Glucose, Bld: 104 mg/dL — ABNORMAL HIGH (ref 65–99)
Potassium: 4 mmol/L (ref 3.5–5.1)
Sodium: 135 mmol/L (ref 135–145)
Total Bilirubin: 0.6 mg/dL (ref 0.3–1.2)
Total Protein: 5.8 g/dL — ABNORMAL LOW (ref 6.5–8.1)

## 2014-09-30 LAB — CBC
HEMATOCRIT: 26.1 % — AB (ref 36.0–46.0)
Hemoglobin: 8.5 g/dL — ABNORMAL LOW (ref 12.0–15.0)
MCH: 27.5 pg (ref 26.0–34.0)
MCHC: 32.6 g/dL (ref 30.0–36.0)
MCV: 84.5 fL (ref 78.0–100.0)
Platelets: 265 10*3/uL (ref 150–400)
RBC: 3.09 MIL/uL — AB (ref 3.87–5.11)
RDW: 14.6 % (ref 11.5–15.5)
WBC: 7.7 10*3/uL (ref 4.0–10.5)

## 2014-09-30 MED ORDER — DOCUSATE SODIUM 100 MG PO CAPS
100.0000 mg | ORAL_CAPSULE | Freq: Two times a day (BID) | ORAL | Status: DC
  Administered 2014-09-30 – 2014-10-01 (×3): 100 mg via ORAL
  Filled 2014-09-30 (×3): qty 1

## 2014-09-30 NOTE — Progress Notes (Signed)
1 Day Post-Op Procedure(s) (LRB): HYSTERECTOMY ABDOMINAL (N/A)  Subjective: Patient reports tolerating PO and no problems voiding.  Nausea improved, tolerated breakfast w/out emesis  Objective: I have reviewed patient's vital signs, intake and output, medications and labs.  General: alert and cooperative GI: normal findings: soft, non-tender Extremities: extremities normal, atraumatic, no cyanosis or edema Vaginal Bleeding: none  Assessment: s/p Procedure(s): HYSTERECTOMY ABDOMINAL (N/A): progressing well  Plan: Advance diet Encourage ambulation Advance to PO medication Discontinue IV fluids  LOS: 1 day    Erin Morton 09/30/2014, 8:37 AM

## 2014-09-30 NOTE — Progress Notes (Signed)
Call received from Dr Julien Girt related to message left by day shift nurse. Relayed patient complaints of continued nausea and vomiting. Total of 1052ml emesis. Orders received.

## 2014-10-01 MED ORDER — DOCUSATE SODIUM 100 MG PO CAPS: 100.0000 mg | ORAL_CAPSULE | Freq: Two times a day (BID) | ORAL | Status: DC

## 2014-10-01 MED ORDER — OXYCODONE-ACETAMINOPHEN 5-325 MG PO TABS: 1.0000 | ORAL_TABLET | ORAL | Status: DC | PRN

## 2014-10-01 MED ORDER — IBUPROFEN 600 MG PO TABS: 600.0000 mg | ORAL_TABLET | Freq: Four times a day (QID) | ORAL | Status: DC | PRN

## 2014-10-01 NOTE — Progress Notes (Signed)
Patient discharged home with family... Discharge instructions reviewed with patient and she verbalized understanding... Condition stable... No equipment... Ambulated to car with Erin Morton, NT.

## 2014-10-01 NOTE — Discharge Summary (Signed)
Physician Discharge Summary  Patient ID: Erin Morton MRN: [id number] DOB/AGE: [birth year] 40 y.o.  Admit date: 09/29/2014 Discharge date: 10/01/2014  Admission Diagnoses:  fibroids  Discharge Diagnoses:  Active Problems:   Fibroids   Discharged Condition: good  Hospital Course: pt was admitted for postop care.  Initially pain was controlled with IV pain meds.  As she advanced her diet, she was changed to PO meds.   On POD1, foley catheter was removed and she was able to void and ambulate without difficulty.  Vital signs and labs remained stable.  POD#2, she was tolerating a regular diet and passing flatus.  Stable for discharge  Consults: None  Significant Diagnostic Studies: labs: cbc  Treatments: IV hydration, analgesia: PCA and surgery: TAH  Discharge Exam: Blood pressure 107/71, pulse 83, temperature 98.3 F (36.8 C), temperature source Oral, resp. rate 18, height 5\' 7"  (1.702 m), weight 198 lb (89.812 kg), SpO2 97 %. General appearance: alert and cooperative GI: normal findings: soft, non-tender Extremities: extremities normal, atraumatic, no cyanosis or edema Incision/Wound:bandage clean  Disposition: 01-Home or Self Care     Medication List    STOP taking these medications        ADVIL MIGRAINE 200 MG Caps  Generic drug:  Ibuprofen  Replaced by:  ibuprofen 600 MG tablet      TAKE these medications        docusate sodium 100 MG capsule  Commonly known as:  COLACE  Take 1 capsule (100 mg total) by mouth 2 (two) times daily.     ibuprofen 600 MG tablet  Commonly known as:  ADVIL  Take 1 tablet (600 mg total) by mouth every 6 (six) hours as needed for moderate pain.     oxyCODONE-acetaminophen 5-325 MG per tablet  Commonly known as:  PERCOCET/ROXICET  Take 1-2 tablets by mouth every 4 (four) hours as needed for severe pain (moderate to severe pain (when tolerating fluids)).           Follow-up Information    Schedule an appointment as soon as possible  for a visit in 2 weeks to follow up.      Signed: Kevyn Boquet 10/01/2014, 8:33 AM

## 2014-10-01 NOTE — Discharge Instructions (Signed)
Abdominal Hysterectomy, Care After Refer to this sheet in the next few weeks. These instructions provide you with information on caring for yourself after your procedure. Your health care provider may also give you more specific instructions. Your treatment has been planned according to current medical practices, but problems sometimes occur. Call your health care provider if you have any problems or questions after your procedure.  WHAT TO EXPECT AFTER THE PROCEDURE After your procedure, it is typical to have the following:  Pain.  Feeling tired.  Poor appetite.  Less interest in sex. HOME CARE INSTRUCTIONS  It takes 4-6 weeks to recover from this surgery. Make sure you follow all your health care provider's instructions. Home care instructions may include:  Take pain medicines only as directed by your health care provider. Do not take over-the-counter pain medicines without checking with your health care provider first.  Change your bandage as directed by your health care provider.  Return to your health care provider to have your sutures taken out.  Take showers instead of baths for 2-3 weeks. Ask your health care provider when it is safe to start showering.  Do not douche, use tampons, or have sexual intercourse for at least 6 weeks or until your health care provider says you can.   Follow your health care provider's advice about exercise, lifting, driving, and general activities.  Get plenty of rest and sleep.   Do not lift anything heavier than a gallon of milk (about 10 lb [4.5 kg]) for the first month after surgery.  You can resume your normal diet if your health care provider says it is okay.   Do not drink alcohol until your health care provider says you can.   If you are constipated, ask your health care provider if you can take a mild laxative.  Eating foods high in fiber may also help with constipation. Eat plenty of raw fruits and vegetables, whole grains, and  beans.  Drink enough fluids to keep your urine clear or pale yellow.   Try to have someone at home with you for the first 1-2 weeks to help around the house.  Keep all follow-up appointments. SEEK MEDICAL CARE IF:   You have chills or fever.  You have swelling, redness, or pain in the area of your incision that is getting worse.   You have pus coming from the incision.   You notice a bad smell coming from the incision or bandage.   Your incision breaks open.   You feel dizzy or light-headed.   You have pain or bleeding when you urinate.   You have persistent diarrhea.   You have persistent nausea and vomiting.   You have abnormal vaginal discharge.   You have a rash.   You have any type of abnormal reaction or develop an allergy to your medicine.   Your pain medicine is not helping.  SEEK IMMEDIATE MEDICAL CARE IF:   You have a fever and your symptoms suddenly get worse.  You have severe abdominal pain.  You have chest pain.  You have shortness of breath.  You faint.  You have pain, swelling, or redness of your leg.  You have heavy vaginal bleeding with blood clots. MAKE SURE YOU:  Understand these instructions.  Will watch your condition.  Will get help right away if you are not doing well or get worse. Document Released: 08/26/2004 Document Revised: 02/11/2013 Document Reviewed: 11/29/2012 Exit[employer] Patient Information 2015 [employer]. This information is not intended   to replace advice given to you by your health care provider. Make sure you discuss any questions you have with your health care provider.  

## 2014-11-09 ENCOUNTER — Emergency Department (HOSPITAL_COMMUNITY)
Admission: EM | Admit: 2014-11-09 | Discharge: 2014-11-09 | Disposition: A | Discharge status: Home / Self Care | Payer: 59 | Attending: Emergency Medicine | Admitting: Emergency Medicine

## 2014-11-09 ENCOUNTER — Emergency Department (HOSPITAL_COMMUNITY): Payer: 59

## 2014-11-09 ENCOUNTER — Encounter (HOSPITAL_COMMUNITY): Payer: Self-pay | Admitting: Emergency Medicine

## 2014-11-09 DIAGNOSIS — W25XXXA Contact with sharp glass, initial encounter: Secondary | ICD-10-CM | POA: Diagnosis not present

## 2014-11-09 DIAGNOSIS — Y9289 Other specified places as the place of occurrence of the external cause: Secondary | ICD-10-CM | POA: Diagnosis not present

## 2014-11-09 DIAGNOSIS — S61211A Laceration without foreign body of left index finger without damage to nail, initial encounter: Secondary | ICD-10-CM | POA: Diagnosis present

## 2014-11-09 DIAGNOSIS — Z23 Encounter for immunization: Secondary | ICD-10-CM | POA: Insufficient documentation

## 2014-11-09 DIAGNOSIS — S61219A Laceration without foreign body of unspecified finger without damage to nail, initial encounter: Secondary | ICD-10-CM

## 2014-11-09 DIAGNOSIS — Z8679 Personal history of other diseases of the circulatory system: Secondary | ICD-10-CM | POA: Diagnosis not present

## 2014-11-09 DIAGNOSIS — S61213A Laceration without foreign body of left middle finger without damage to nail, initial encounter: Secondary | ICD-10-CM | POA: Insufficient documentation

## 2014-11-09 DIAGNOSIS — Y998 Other external cause status: Secondary | ICD-10-CM | POA: Insufficient documentation

## 2014-11-09 DIAGNOSIS — Y9389 Activity, other specified: Secondary | ICD-10-CM | POA: Diagnosis not present

## 2014-11-09 MED ORDER — LIDOCAINE HCL (PF) 1 % IJ SOLN
20.0000 mL | Freq: Once | INTRAMUSCULAR | Status: AC
  Administered 2014-11-09: 20 mL
  Filled 2014-11-09: qty 20

## 2014-11-09 MED ORDER — TETANUS-DIPHTH-ACELL PERTUSSIS 5-2.5-18.5 LF-MCG/0.5 IM SUSP
0.5000 mL | Freq: Once | INTRAMUSCULAR | Status: AC
  Administered 2014-11-09: 0.5 mL via INTRAMUSCULAR
  Filled 2014-11-09: qty 0.5

## 2014-11-09 MED ORDER — NAPROXEN 250 MG PO TABS: 250.0000 mg | ORAL_TABLET | Freq: Two times a day (BID) | ORAL | Status: DC

## 2014-11-09 NOTE — ED Notes (Signed)
Patient transported to X-ray 

## 2014-11-09 NOTE — ED Notes (Signed)
ED PA at bedside

## 2014-11-09 NOTE — Discharge Instructions (Signed)

## 2014-11-09 NOTE — ED Notes (Signed)
Pt c/o deep laceration to left finger onset today at 1230, pt cut finger on mirror. Motor function and sensation intact. Minimal bleeding.

## 2014-11-09 NOTE — ED Provider Notes (Signed)
CSN: [id number]     Arrival date & time 11/09/14  1410 History  This chart was scribed for non-physician practitioner, Waynetta Pean, PA-C working with Leonard Schwartz, MD by Rayna Sexton, ED scribe. This patient was seen in room WTR6/WTR6 and the patient's care was started at 3:50 PM.   Chief Complaint  Patient presents with  . Extremity Laceration   The history is provided by the patient. No language interpreter was used.    HPI Comments: Erin Morton is a 39 y.o. female, who is right hand dominant, presents to the Emergency Department complaining of 2 lacerations with controlled bleeding to her left index finger and 1 laceration to her middle finger with onset at 12:30 PM. Pt notes cutting the affected fingers on a mirror and further notes some associated tingling to her left hand and forearm. She notes her last tdap was "a long time ago". She denies any numbness.   Past Medical History  Diagnosis Date  . Migraines   . Chest pain     recent episodes of chest pain after walking  . Shortness of breath dyspnea     on exertion  . Heartburn    Past Surgical History  Procedure Laterality Date  . Breast lumpectomy      right breast, pt was 39 yo  . Abdominal hysterectomy N/A 09/29/2014    Procedure: HYSTERECTOMY ABDOMINAL;  Surgeon: Marylynn Pearson, MD;  Location: [facility];  Service: Gynecology;  Laterality: N/A;   Family History  Problem Relation Age of Onset  . Diabetes Father   . Hypertension Mother    Social History  Substance Use Topics  . Smoking status: Never Smoker   . Smokeless tobacco: Never Used  . Alcohol Use: Yes     Comment: occasional   OB History    Gravida Para Term Preterm AB TAB SAB Ectopic Multiple Living   3 3 3       3      Review of Systems  Constitutional: Negative for fever and chills.  Skin: Positive for wound.  Neurological: Negative for weakness and numbness.   Allergies  Review of patient's allergies indicates no known allergies.  Home  Medications   Prior to Admission medications   Medication Sig Start Date End Date Taking? Authorizing Provider  oxyCODONE-acetaminophen (PERCOCET/ROXICET) 5-325 MG per tablet Take 1-2 tablets by mouth every 4 (four) hours as needed for severe pain (moderate to severe pain (when tolerating fluids)). 10/01/14  Yes Marylynn Pearson, MD  naproxen (NAPROSYN) 250 MG tablet Take 1 tablet (250 mg total) by mouth 2 (two) times daily with a meal. 11/09/14   Waynetta Pean, PA-C   BP 136/90 mmHg  Pulse 97  Temp(Src) 98.2 F (36.8 C) (Oral)  Resp 18  SpO2 98%  LMP 09/10/2014 Physical Exam  Constitutional: She appears well-developed and well-nourished. No distress.  HENT:  Head: Normocephalic and atraumatic.  Eyes: Right eye exhibits no discharge. Left eye exhibits no discharge.  Cardiovascular: Normal rate and intact distal pulses.   Bilateral radial pulses are intact. Patient has good capillary refill to her left distal fingertips.  Pulmonary/Chest: Effort normal. No respiratory distress.  Musculoskeletal: Normal range of motion. She exhibits tenderness.  4 cm laceration over PIP joint of left index finger with evidence of a transverse laceration of an extensor tendon. There is a 1 cm linear laceration over PIP joint of left middle finger; bleeding controlled; good cap refill of both fingers. Good strength at each joint of her left  index and left long fingers.   Neurological: She is alert. Coordination normal.  Sensation is intact to her left distal fingertips.  Skin: Skin is warm and dry. Laceration noted. No rash noted. She is not diaphoretic. No erythema. No pallor.     4 cm laceration over PIP joint of left index finger with evidence of a transverse laceration of an extensor tendon.  There is a 1 cm linear laceration over PIP joint of left middle finger; bleeding controlled; good cap refill of both fingers  Psychiatric: She has a normal mood and affect. Her behavior is normal.  Nursing note and  vitals reviewed.  ED Course  LACERATION REPAIR Date/Time: 11/09/2014 5:30 PM Performed by: Waynetta Pean Authorized by: Waynetta Pean Consent: Verbal consent obtained. Risks and benefits: risks, benefits and alternatives were discussed Consent given by: patient Patient understanding: patient states understanding of the procedure being performed Patient consent: the patient's understanding of the procedure matches consent given Procedure consent: procedure consent matches procedure scheduled Relevant documents: relevant documents present and verified Test results: test results available and properly labeled Site marked: the operative site was marked Imaging studies: imaging studies available Required items: required blood products, implants, devices, and special equipment available Patient identity confirmed: verbally with patient Time out: Immediately prior to procedure a "time out" was called to verify the correct patient, procedure, equipment, support staff and site/side marked as required. Body area: upper extremity Location details: left index finger Laceration length: 4 cm Foreign bodies: no foreign bodies Tendon involvement: superficial Nerve involvement: none Vascular damage: no Anesthesia: digital block Local anesthetic: lidocaine 1% without epinephrine Anesthetic total: 4 ml Patient sedated: no Preparation: Patient was prepped and draped in the usual sterile fashion. Irrigation solution: saline Irrigation method: jet lavage Amount of cleaning: extensive Debridement: none Degree of undermining: none Skin closure: 5-0 Prolene Number of sutures: 5 Technique: simple Approximation: loose Approximation difficulty: complex Dressing: non-adhesive packing strip, 4x4 sterile gauze, gauze roll and splint Patient tolerance: Patient tolerated the procedure well with no immediate complications  LACERATION REPAIR Date/Time: 11/09/2014 5:45 PM Performed by: Waynetta Pean Authorized by: Waynetta Pean Consent: Verbal consent obtained. Risks and benefits: risks, benefits and alternatives were discussed Consent given by: patient Patient understanding: patient states understanding of the procedure being performed Patient consent: the patient's understanding of the procedure matches consent given Procedure consent: procedure consent matches procedure scheduled Relevant documents: relevant documents present and verified Test results: test results available and properly labeled Site marked: the operative site was marked Imaging studies: imaging studies available Required items: required blood products, implants, devices, and special equipment available Patient identity confirmed: verbally with patient Time out: Immediately prior to procedure a "time out" was called to verify the correct patient, procedure, equipment, support staff and site/side marked as required. Body area: upper extremity Location details: left long finger Laceration length: 1 cm Foreign bodies: no foreign bodies Tendon involvement: none Nerve involvement: none Vascular damage: no Anesthesia: digital block Local anesthetic: lidocaine 1% without epinephrine Anesthetic total: 3 ml Patient sedated: no Preparation: Patient was prepped and draped in the usual sterile fashion. Irrigation solution: saline Irrigation method: jet lavage Amount of cleaning: extensive Debridement: none Degree of undermining: none Skin closure: 5-0 Prolene Number of sutures: 2 Technique: simple Approximation: close Approximation difficulty: simple Dressing: 4x4 sterile gauze, pressure dressing and non-adhesive packing strip Patient tolerance: Patient tolerated the procedure well with no immediate complications    DIAGNOSTIC STUDIES: Oxygen Saturation is 98% on RA, normal by my  interpretation.    COORDINATION OF CARE: 3:55 PM Discussed treatment plan with pt at bedside including irrigation of the  wounds, digital block of the affected fingers and sutures. Pt agreed to plan.    Labs Review Labs Reviewed - No data to display  Imaging Review Dg Hand Complete Left  11/09/2014   CLINICAL DATA:  Laceration to left index and third fingers due to shattered mirror  EXAM: LEFT HAND - COMPLETE 3+ VIEW  COMPARISON:  03/03/09  FINDINGS: No acute fracture or dislocation. Two orthopedic pins through the third middle phalanx for prior ORIF. No acute radiodense foreign body. Focal laceration along the ulnar side soft tissues second middle phalanx region.  IMPRESSION: Laceration noted with no fracture dislocation or radiodense foreign body.   Electronically Signed   By: Skipper Cliche M.D.   On: 11/09/2014 15:40   I have personally reviewed and evaluated these images and lab results as part of my medical decision-making.   EKG Interpretation None      Filed Vitals:   11/09/14 1435  BP: 136/90  Pulse: 97  Temp: 98.2 F (36.8 C)  TempSrc: Oral  Resp: 18  SpO2: 98%     MDM   Meds given in ED:  Medications  lidocaine (PF) (XYLOCAINE) 1 % injection 20 mL (not administered)  Tdap (BOOSTRIX) injection 0.5 mL (0.5 mLs Intramuscular Given 11/09/14 1623)    New Prescriptions   NAPROXEN (NAPROSYN) 250 MG TABLET    Take 1 tablet (250 mg total) by mouth 2 (two) times daily with a meal.    Final diagnoses:  Finger laceration, initial encounter   This is a 39 year old female who presents to the emergency department complaining of a laceration to her left index and long finger after it was cut by broken mirror. X-rays unremarkable. On exam the patient has a 4 cm irregular laceration to her left index finger with evidence of a transverse cut through and extensor tendon. This patient was seen and evaluated by Dr. Audie Pinto who believes the patient can have her skin tacked down in follow-up with hand. Patient also has a 1 cm linear laceration over her left long finger PIP joint. Incisions were repaired by  me and tolerated well by the patient. Patient has good strength and sensation of her left distal fingertips. She has good strength in each of her joints in her left index and long finger. I consulted with Dr. Apolonio Schneiders who advises she can follow-up in office later this week. I advised patient to call Dr. Lequita Asal office tomorrow to make an appointment for follow-up. I advised the patient to follow-up with their primary care provider this week. I advised the patient to return to the emergency department with new or worsening symptoms or new concerns. The patient verbalized understanding and agreement with plan.    This patient was discussed with and evaluated by Dr. Audie Pinto who agrees with assessment and plan.  I personally performed the services described in this documentation, which was scribed in my presence. The recorded information has been reviewed and is accurate.    Waynetta Pean, PA-C 11/09/14 1810  Leonard Schwartz, MD 11/09/14 [phone]

## 2015-07-01 ENCOUNTER — Other Ambulatory Visit: Payer: Self-pay | Admitting: Plastic Surgery

## 2016-07-21 ENCOUNTER — Other Ambulatory Visit: Payer: Self-pay | Admitting: Internal Medicine

## 2016-07-21 DIAGNOSIS — Z1231 Encounter for screening mammogram for malignant neoplasm of breast: Secondary | ICD-10-CM

## 2016-08-07 ENCOUNTER — Ambulatory Visit
Admission: RE | Admit: 2016-08-07 | Discharge: 2016-08-07 | Disposition: A | Discharge status: Home / Self Care | Payer: Medicaid Other | Source: Ambulatory Visit | Attending: Internal Medicine | Admitting: Internal Medicine

## 2016-08-07 ENCOUNTER — Encounter: Payer: Self-pay | Admitting: Radiology

## 2016-08-07 DIAGNOSIS — Z1231 Encounter for screening mammogram for malignant neoplasm of breast: Secondary | ICD-10-CM

## 2016-08-09 ENCOUNTER — Other Ambulatory Visit: Payer: Self-pay | Admitting: Internal Medicine

## 2016-08-09 DIAGNOSIS — R928 Other abnormal and inconclusive findings on diagnostic imaging of breast: Secondary | ICD-10-CM

## 2016-08-14 ENCOUNTER — Ambulatory Visit
Admission: RE | Admit: 2016-08-14 | Discharge: 2016-08-14 | Disposition: A | Discharge status: Home / Self Care | Payer: Medicaid Other | Source: Ambulatory Visit | Attending: Internal Medicine | Admitting: Internal Medicine

## 2016-08-14 DIAGNOSIS — R928 Other abnormal and inconclusive findings on diagnostic imaging of breast: Secondary | ICD-10-CM

## 2016-11-07 ENCOUNTER — Encounter (HOSPITAL_BASED_OUTPATIENT_CLINIC_OR_DEPARTMENT_OTHER): Payer: Self-pay

## 2016-11-07 ENCOUNTER — Emergency Department (HOSPITAL_BASED_OUTPATIENT_CLINIC_OR_DEPARTMENT_OTHER): Payer: Medicaid Other

## 2016-11-07 ENCOUNTER — Emergency Department (HOSPITAL_COMMUNITY): Admission: EM | Admit: 2016-11-07 | Discharge: 2016-11-07 | Discharge status: Against Medical Advice | Payer: Self-pay

## 2016-11-07 DIAGNOSIS — S93105A Unspecified dislocation of left toe(s), initial encounter: Secondary | ICD-10-CM | POA: Insufficient documentation

## 2016-11-07 DIAGNOSIS — Y929 Unspecified place or not applicable: Secondary | ICD-10-CM | POA: Diagnosis not present

## 2016-11-07 DIAGNOSIS — Y999 Unspecified external cause status: Secondary | ICD-10-CM | POA: Insufficient documentation

## 2016-11-07 DIAGNOSIS — W228XXA Striking against or struck by other objects, initial encounter: Secondary | ICD-10-CM | POA: Diagnosis not present

## 2016-11-07 DIAGNOSIS — Y9301 Activity, walking, marching and hiking: Secondary | ICD-10-CM | POA: Insufficient documentation

## 2016-11-07 DIAGNOSIS — S99922A Unspecified injury of left foot, initial encounter: Secondary | ICD-10-CM | POA: Diagnosis present

## 2016-11-07 NOTE — ED Notes (Signed)
No response. For triage

## 2016-11-07 NOTE — ED Triage Notes (Signed)
Pt states she tripped approx 1 hour PTA-pain to left 2nd/3rd toes-deformity noted to 2nd toe-NAD-presents to triage in w/c

## 2016-11-07 NOTE — ED Triage Notes (Signed)
Pt called to be triaged with no answer x 1

## 2016-11-08 ENCOUNTER — Emergency Department (HOSPITAL_BASED_OUTPATIENT_CLINIC_OR_DEPARTMENT_OTHER)
Admission: EM | Admit: 2016-11-08 | Discharge: 2016-11-08 | Disposition: A | Discharge status: Home / Self Care | Payer: Medicaid Other | Attending: Emergency Medicine | Admitting: Emergency Medicine

## 2016-11-08 DIAGNOSIS — S93105A Unspecified dislocation of left toe(s), initial encounter: Secondary | ICD-10-CM

## 2016-11-08 MED ORDER — HYDROCODONE-ACETAMINOPHEN 5-325 MG PO TABS: 1.0000 | ORAL_TABLET | ORAL | 0 refills | Status: DC | PRN

## 2016-11-08 MED ORDER — IBUPROFEN 800 MG PO TABS
800.0000 mg | ORAL_TABLET | Freq: Once | ORAL | Status: AC
  Administered 2016-11-08: 800 mg via ORAL
  Filled 2016-11-08: qty 1

## 2016-11-08 MED ORDER — BUPIVACAINE HCL 0.5 % IJ SOLN
50.0000 mL | Freq: Once | INTRAMUSCULAR | Status: AC
  Administered 2016-11-08: 50 mL
  Filled 2016-11-08: qty 1

## 2016-11-08 NOTE — ED Notes (Signed)
ED Provider at bedside. 

## 2016-11-08 NOTE — ED Provider Notes (Addendum)
[facility] Provider Note: Georgena Spurling, MD, FACEP  CSN: [id number] MRN: [id number] ARRIVAL: 11/07/16 at 2237 ROOM: [place]  Toe Injury   HISTORY OF PRESENT ILLNESS  11/08/16 12:23 AM Erin Morton is a 41 y.o. female who stubbed her right second and third toes against a curb about an hour prior to arrival. There is a deformity to the right second toe with 7 out of 10 pain. Pain is worse with attempted movement or palpation. The toe is not insensate but there are paresthesias distally. She denies other injury.  Consultation with the [facility] state controlled substances database reveals the patient has received no opioid prescriptions in the past year.   Past Medical History:  Diagnosis Date  . Chest pain    recent episodes of chest pain after walking  . Heartburn   . Migraines   . Shortness of breath dyspnea    on exertion    Past Surgical History:  Procedure Laterality Date  . ABDOMINAL HYSTERECTOMY N/A 09/29/2014   Procedure: HYSTERECTOMY ABDOMINAL;  Surgeon: Marylynn Pearson, MD;  Location: [facility];  Service: Gynecology;  Laterality: N/A;  . BREAST EXCISIONAL BIOPSY Right    benign age 12  . BREAST LUMPECTOMY     right breast, pt was 41 yo    Family History  Problem Relation Age of Onset  . Diabetes Father   . Hypertension Mother   . Breast cancer Paternal Aunt     Social History  Substance Use Topics  . Smoking status: Never Smoker  . Smokeless tobacco: Never Used  . Alcohol use Yes     Comment: occasional    Prior to Admission medications   Medication Sig Start Date End Date Taking? Authorizing Provider  oxyCODONE-acetaminophen (PERCOCET/ROXICET) 5-325 MG per tablet Take 1-2 tablets by mouth every 4 (four) hours as needed for severe pain (moderate to severe pain (when tolerating fluids)). 10/01/14   Marylynn Pearson, MD    Allergies Patient has no known allergies.   REVIEW OF SYSTEMS  Negative except as noted  here or in the History of Present Illness.   PHYSICAL EXAMINATION  Initial Vital Signs Blood pressure 128/87, pulse 89, temperature 98.3 F (36.8 C), temperature source Oral, resp. rate 18, last menstrual period 09/10/2014, SpO2 98 %.  Examination General: Well-developed, well-nourished female in no acute distress; appearance consistent with age of record HENT: normocephalic; atraumatic Eyes: pupils equal, round and reactive to light; extraocular muscles intact Neck: supple Heart: regular rate and rhythm Lungs: clear to auscultation bilaterally Abdomen: soft; nondistended; nontender; bowel sounds present Extremities: No deformity; full range of motion except right second toe; pulses normal; deformity of right second toe, decreased sensation of distal toe with brisk capillary refill Neurologic: Awake, alert and oriented; motor function intact in all extremities and symmetric; no facial droop Skin: Warm and dry Psychiatric: Normal mood and affect   RESULTS  Summary of this visit's results, reviewed by myself:   EKG Interpretation  Date/Time:    Ventricular Rate:    PR Interval:    QRS Duration:   QT Interval:    QTC Calculation:   R Axis:     Text Interpretation:        Laboratory Studies: No results found for this or any previous visit (from the past 24 hour(s)). Imaging Studies: Dg Foot Complete Left  Result Date: 11/07/2016 CLINICAL DATA:  Injury tonight with pain and deformity of the left second toe. EXAM: LEFT FOOT -  COMPLETE 3+ VIEW COMPARISON:  None. FINDINGS: There is dislocation of the proximal interphalangeal joint of the left second toe. No fractures are identified. Left foot appears otherwise unremarkable. Soft tissues are unremarkable. IMPRESSION: Dislocation of the proximal interphalangeal joint of the left second toe. Electronically Signed   By: Lucienne Capers M.D.   On: 11/07/2016 23:11    ED COURSE  Nursing notes and initial vitals signs, including  pulse oximetry, reviewed.  Vitals:   11/07/16 2245  BP: 128/87  Pulse: 89  Resp: 18  Temp: 98.3 F (36.8 C)  TempSrc: Oral  SpO2: 98%    PROCEDURES   CLOSED REDUCTION The patient's left second toe was prepped with Betadine. A digital block was then performed by injecting approximately 4 milliliters of 0.5% bupivacaine without epinephrine into the base the toe. After adequate anesthesia was obtained the dislocation was reduced with gentle traction. Anatomic alignment was achieved. The patient tolerated this well and there were no immediate complications.  ED DIAGNOSES     ICD-10-CM   1. Dislocated toe, left, initial encounter S93.105A        Shanon Rosser, MD 11/08/16 0109    Shanon Rosser, MD 11/08/16 [phone]

## 2017-02-18 ENCOUNTER — Encounter (HOSPITAL_BASED_OUTPATIENT_CLINIC_OR_DEPARTMENT_OTHER): Payer: Self-pay | Admitting: Emergency Medicine

## 2017-02-18 ENCOUNTER — Emergency Department (HOSPITAL_BASED_OUTPATIENT_CLINIC_OR_DEPARTMENT_OTHER): Payer: Medicaid Other

## 2017-02-18 ENCOUNTER — Other Ambulatory Visit: Payer: Self-pay

## 2017-02-18 ENCOUNTER — Emergency Department (HOSPITAL_BASED_OUTPATIENT_CLINIC_OR_DEPARTMENT_OTHER)
Admission: EM | Admit: 2017-02-18 | Discharge: 2017-02-18 | Disposition: A | Discharge status: Home / Self Care | Payer: Medicaid Other | Attending: Emergency Medicine | Admitting: Emergency Medicine

## 2017-02-18 DIAGNOSIS — M25512 Pain in left shoulder: Secondary | ICD-10-CM

## 2017-02-18 MED ORDER — IBUPROFEN 800 MG PO TABS: 800.0000 mg | ORAL_TABLET | Freq: Three times a day (TID) | ORAL | 0 refills | Status: DC | PRN

## 2017-02-18 MED ORDER — METHOCARBAMOL 500 MG PO TABS: 500.0000 mg | ORAL_TABLET | Freq: Two times a day (BID) | ORAL | 0 refills | Status: AC

## 2017-02-18 NOTE — ED Provider Notes (Signed)
Emergency Department Provider Note   I have reviewed the triage vital signs and the nursing notes.   HISTORY  Chief Complaint Shoulder Pain   HPI Erin Morton is a 41 y.o. female with PMH GERD and fibroids to the emergency department for evaluation of left shoulder pain that started yesterday when.  She states that her left shoulder has come out of joint in the past and she felt like it was "trying to come out but didn't."  Patient describes some tingling in the fingers with pain that radiates down the left arm. No neck discomfort.  No chest pain.  No dyspnea.  Pain significant worse with moving the joint.  No fevers or chills.  She is tried over-the-counter medications with no relief.  Past Medical History:  Diagnosis Date  . Chest pain    recent episodes of chest pain after walking  . Heartburn   . Migraines   . Shortness of breath dyspnea    on exertion    Patient Active Problem List   Diagnosis Date Noted  . Fibroids 09/29/2014    Past Surgical History:  Procedure Laterality Date  . ABDOMINAL HYSTERECTOMY N/A 09/29/2014   Procedure: HYSTERECTOMY ABDOMINAL;  Surgeon: Marylynn Pearson, MD;  Location: [facility];  Service: Gynecology;  Laterality: N/A;  . BREAST EXCISIONAL BIOPSY Right    benign age 67  . BREAST LUMPECTOMY     right breast, pt was 41 yo    Current Outpatient Rx  . Order #: [id number] Class: Print  . Order #: [id number] Class: Print  . Order #: [id number] Class: Print    Allergies Patient has no known allergies.  Family History  Problem Relation Age of Onset  . Diabetes Father   . Hypertension Mother   . Breast cancer Paternal Aunt     Social History Social History   Tobacco Use  . Smoking status: Never Smoker  . Smokeless tobacco: Never Used  Substance Use Topics  . Alcohol use: Yes    Comment: occasional  . Drug use: No    Review of Systems  Constitutional: No fever/chills Eyes: No visual changes. ENT: No sore throat. Cardiovascular:  Denies chest pain. Respiratory: Denies shortness of breath. Gastrointestinal: No abdominal pain.  No nausea, no vomiting.  No diarrhea.  No constipation. Genitourinary: Negative for dysuria. Musculoskeletal: Negative for back pain. Positive left shoulder pain.  Skin: Negative for rash. Neurological: Negative for headaches, focal weakness or numbness.  10-point ROS otherwise negative.  ____________________________________________   PHYSICAL EXAM:  VITAL SIGNS: ED Triage Vitals  Enc Vitals Group     BP 02/18/17 1301 125/84     Pulse Rate 02/18/17 1301 85     Resp 02/18/17 1301 18     Temp 02/18/17 1301 98.2 F (36.8 C)     Temp Source 02/18/17 1301 Oral     SpO2 02/18/17 1301 100 %     Weight 02/18/17 1308 190 lb (86.2 kg)     Height 02/18/17 1308 5\' 7"  (1.702 m)     Pain Score 02/18/17 1308 10    Constitutional: Alert and oriented. Well appearing and in no acute distress. Eyes: Conjunctivae are normal.  Head: Atraumatic. Nose: No congestion/rhinnorhea. Mouth/Throat: Mucous membranes are moist.  Neck: No stridor.   Cardiovascular: Normal rate, regular rhythm. Good peripheral circulation. Grossly normal heart sounds.   Respiratory: Normal respiratory effort.  No retractions. Lungs CTAB. Gastrointestinal:  No distention.  Musculoskeletal: No lower extremity tenderness nor edema. No gross deformities of extremities.  Full passive ROM with some pain with adduction. No tenderness to bony prominences in the shoulder. Shoulder appears to be seated well in the joint. No clavicle tenderness. No tenderness in the elbow or wrist.  Neurologic:  Normal speech and language. No gross focal neurologic deficits are appreciated.  Skin:  Skin is warm, dry and intact. No rash noted.  ____________________________________________  EKG   EKG Interpretation  Date/Time:  Sunday February 18 2017 13:21:28 EST Ventricular Rate:  81 PR Interval:  166 QRS Duration: 68 QT Interval:  380 QTC  Calculation: 441 R Axis:   20 Text Interpretation:  Normal sinus rhythm Normal ECG No STEMI.  Confirmed by Nanda Quinton [phone]) on 02/18/2017 2:04:44 PM       ____________________________________________  RADIOLOGY  Dg Shoulder Left  Result Date: 02/18/2017 CLINICAL DATA:  Left shoulder pain for 2 days.  No reported injury. EXAM: LEFT SHOULDER - 2+ VIEW COMPARISON:  None. FINDINGS: There is no evidence of fracture or dislocation. There is no evidence of arthropathy or other focal bone abnormality. Soft tissues are unremarkable. IMPRESSION: Negative. Electronically Signed   By: Ilona Sorrel M.D.   On: 02/18/2017 14:03    ____________________________________________   PROCEDURES  Procedure(s) performed:   Procedures  None ____________________________________________   INITIAL IMPRESSION / ASSESSMENT AND PLAN / ED COURSE  Pertinent labs & imaging results that were available during my care of the patient were reviewed by me and considered in my medical decision making (see chart for details).  Patient presents to the emergency department for evaluation of what appears to be muscular skeletal pain in the left shoulder.  It is reproducible with palpation of the shoulder.  She allows me to passively range the shoulder with worsening pain with adduction.  Plain film of the shoulder pending.  Suspect possible rotator cuff strain.  No evidence to suggest atypical ACS or cervical spine radiculopathy.  EKG from triage is unremarkable.  At this time, I do not feel there is any life-threatening condition present. I have reviewed and discussed all results (EKG, imaging, lab, urine as appropriate), exam findings with patient. I have reviewed nursing notes and appropriate previous records.  I feel the patient is safe to be discharged home without further emergent workup. Discussed usual and customary return precautions. Patient and family (if present) verbalize understanding and are comfortable  with this plan.  Patient will follow-up with their primary care provider. If they do not have a primary care provider, information for follow-up has been provided to them. All questions have been answered.  ____________________________________________  FINAL CLINICAL IMPRESSION(S) / ED DIAGNOSES  Final diagnoses:  Acute pain of left shoulder     NEW OUTPATIENT MEDICATIONS STARTED DURING THIS VISIT:  Motrin and Robaxin   Note:  This document was prepared using Dragon voice recognition software and may include unintentional dictation errors.  Nanda Quinton, MD Emergency Medicine    Long, [facility], MD 02/19/17 [phone]

## 2017-02-18 NOTE — ED Triage Notes (Addendum)
Patient reports left shoulder pain which began after waking up yesterday.  Reports that the pain worsened today and radiates down her left arm.  Denies chest pain, shortness of breath. Denies injury.

## 2017-02-18 NOTE — Discharge Instructions (Signed)

## 2018-04-02 IMAGING — MG 2D DIGITAL DIAGNOSTIC BILATERAL MAMMOGRAM WITH CAD AND ADJUNCT T
8 of 12 series · 8 of 28 positions shown · non-contrast
Comparison: Screening mammogram dated 08/07/2016.

CLINICAL DATA: Screening recall from baseline for bilateral breast
masses.

EXAM:
2D DIGITAL DIAGNOSTIC BILATERAL MAMMOGRAM WITH CAD AND ADJUNCT TOMO
ULTRASOUND BILATERAL BREAST

[L CC synth-2D]
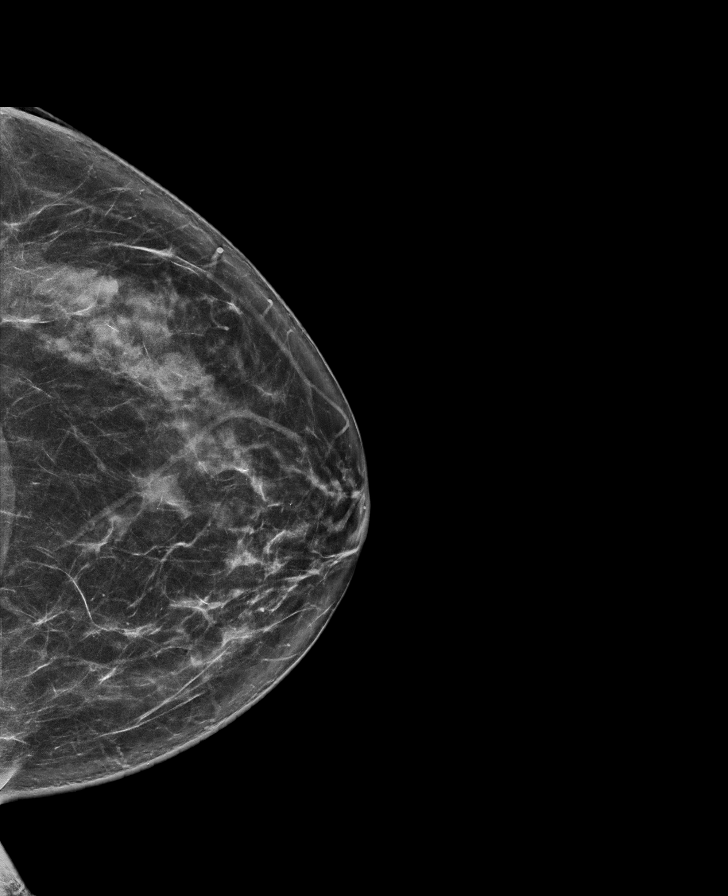

[L MLO synth-2D]
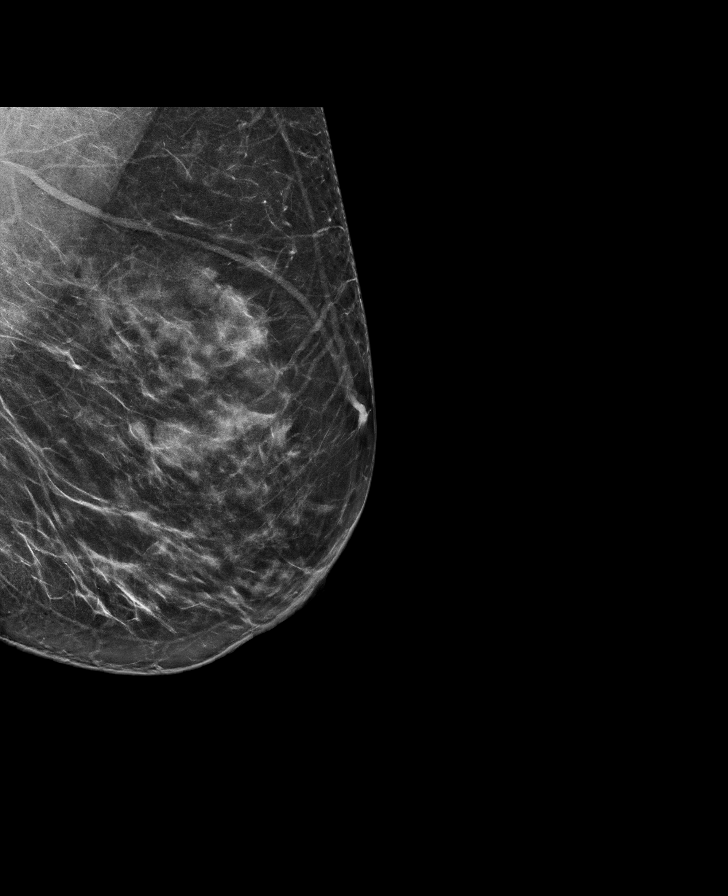

[R MLO]
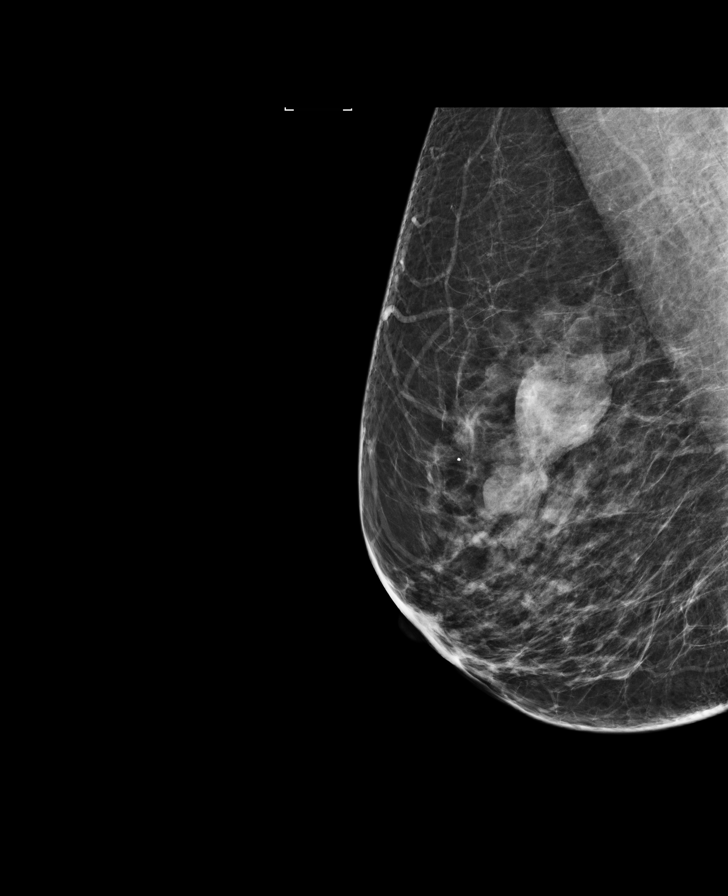

[L CC]
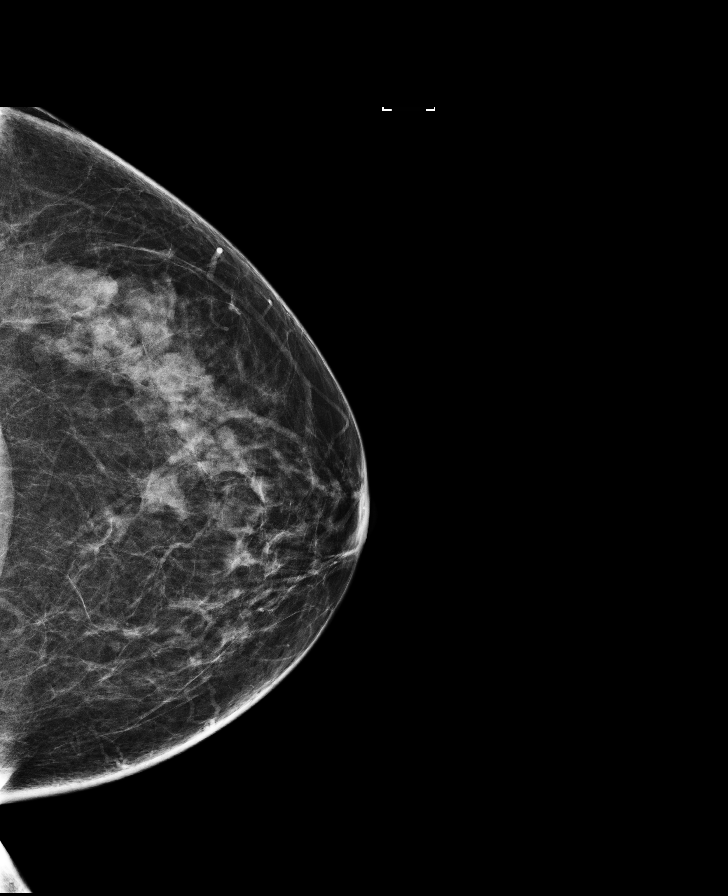

[L MLO]
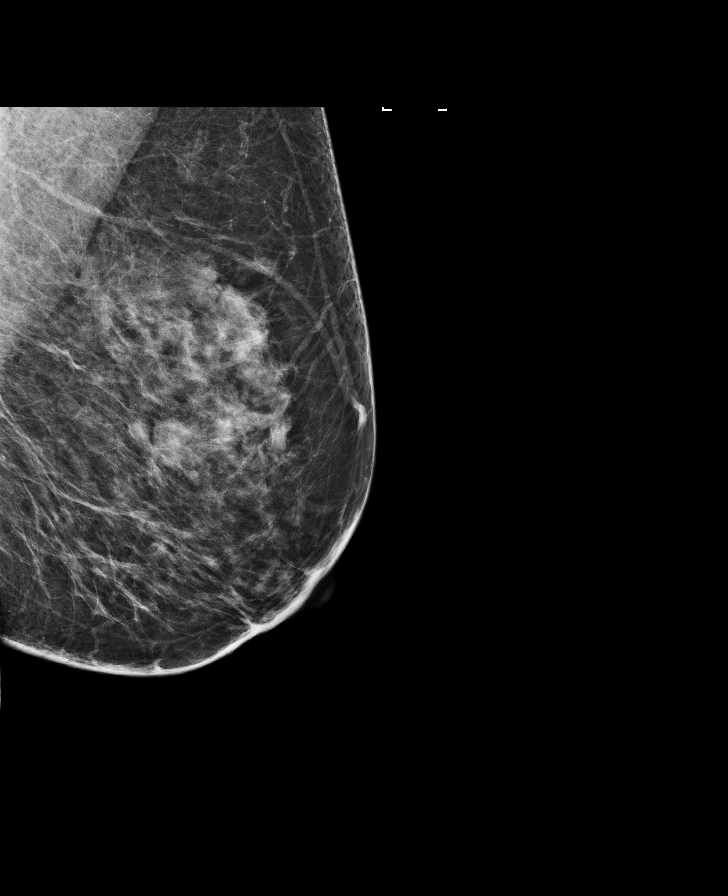

[R CC synth-2D]
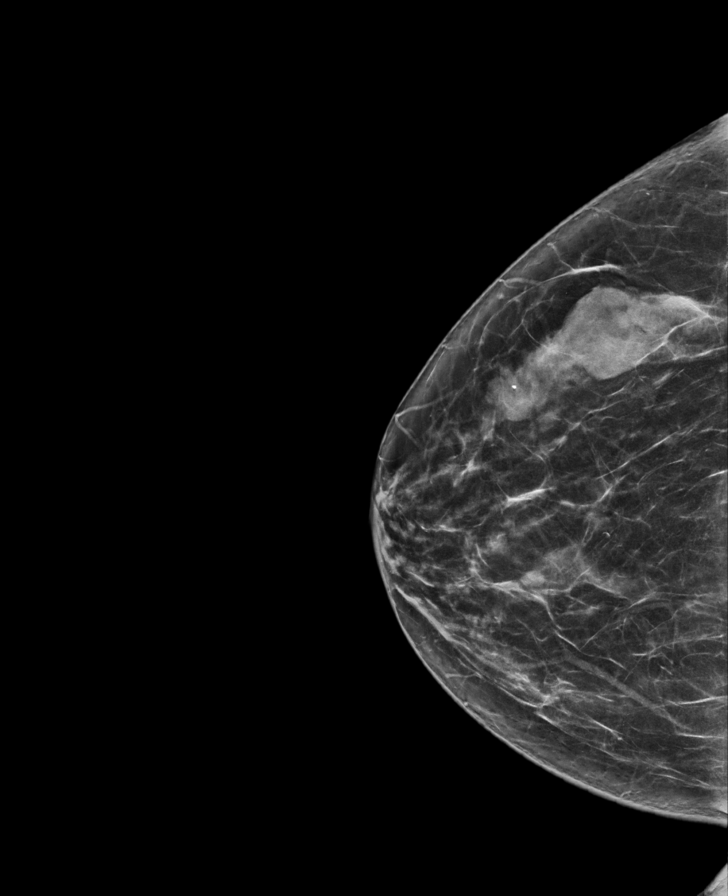

[R CC]
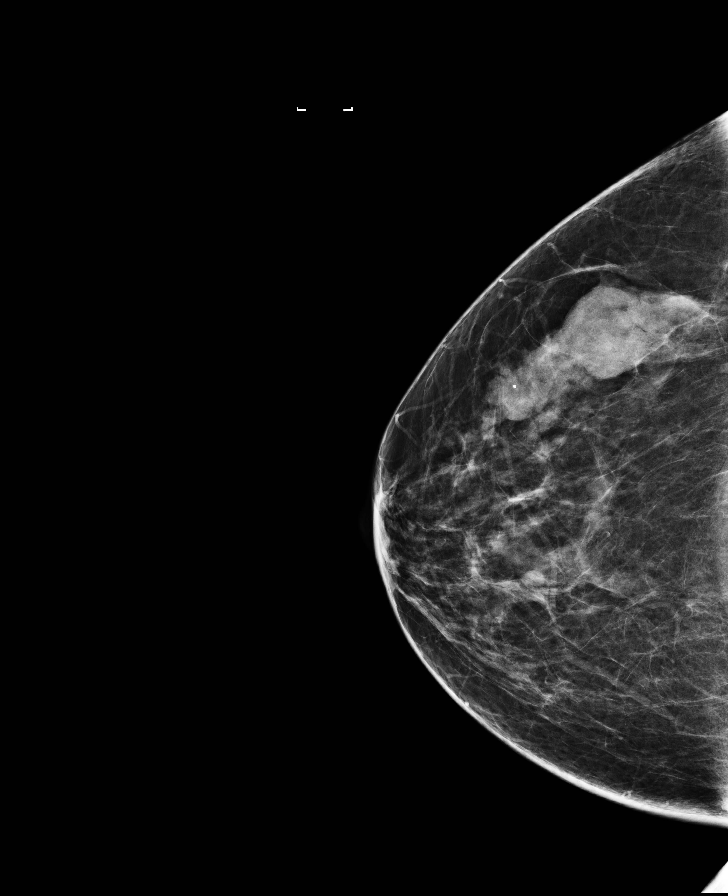

[R MLO synth-2D]
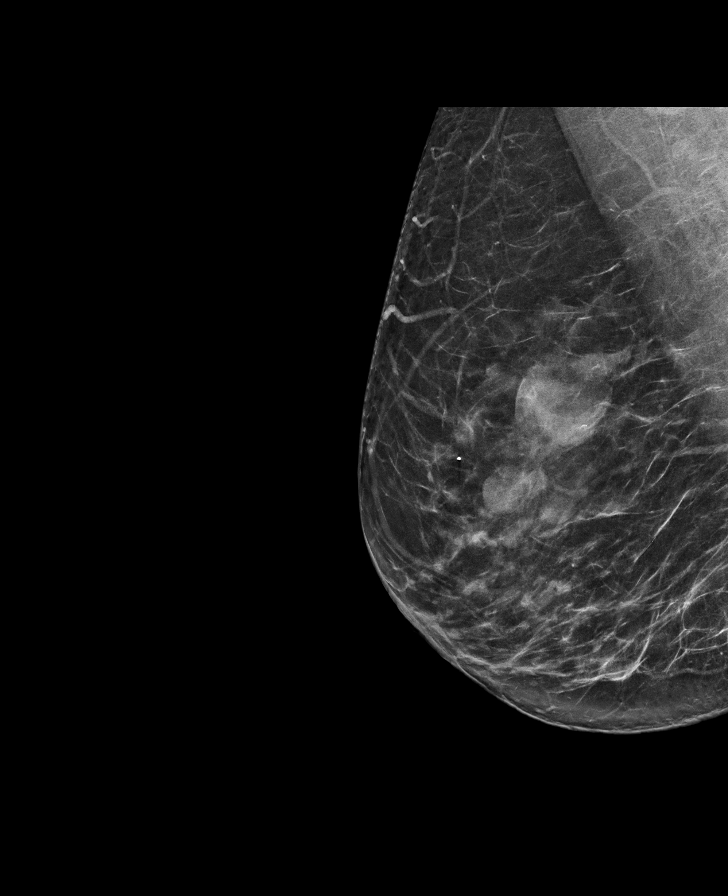

[8 of 28 positions shown; findings below may reference images not displayed]

ACR Breast Density Category c: The breast tissue is heterogeneously
dense, which may obscure small masses.
FINDINGS: In the bilateral breasts, primarily in the lateral aspects, there
are several obscured masses, whose visualized margins appear
circumscribed.

Mammographic images were processed with CAD.

Ultrasound targeted to the right breast at 10 o'clock, 7 cm from the
nipple demonstrates the largest of the masses which measures 2.7 x
1.3 x 2.6 cm. There is mobile internal debris within the mass. The
mass is circumscribed with increased posterior acoustic enhancement
consistent with a complicated cyst. Multiple other benign cysts are
identified in the upper outer and upper inner quadrants of the right
breast with representative images acquired.

Ultrasound of the superior and upper outer quadrant of the left
breast demonstrates multiple benign cysts and clusters of cysts. The
largest of these is at 3 o'clock, 3 cm from the nipple measuring
x 0.8 x 1.2 cm.
IMPRESSION: There are multiple bilateral benign cysts accounting for the masses
identified mammographically.

RECOMMENDATION:
Screening mammogram in one year.(Code:[id number])

I have discussed the findings and recommendations with the patient.
Results were also provided in writing at the conclusion of the
visit. If applicable, a reminder letter will be sent to the patient
regarding the next appointment.

BI-RADS CATEGORY  2: Benign.

## 2018-04-19 ENCOUNTER — Other Ambulatory Visit: Payer: Self-pay | Admitting: Internal Medicine

## 2018-04-19 DIAGNOSIS — N6320 Unspecified lump in the left breast, unspecified quadrant: Principal | ICD-10-CM

## 2018-04-19 DIAGNOSIS — N6310 Unspecified lump in the right breast, unspecified quadrant: Secondary | ICD-10-CM

## 2018-04-24 ENCOUNTER — Other Ambulatory Visit: Payer: Self-pay | Admitting: Internal Medicine

## 2018-04-24 ENCOUNTER — Ambulatory Visit
Admission: RE | Admit: 2018-04-24 | Discharge: 2018-04-24 | Disposition: A | Discharge status: Home / Self Care | Payer: Medicaid Other | Source: Ambulatory Visit | Attending: Internal Medicine | Admitting: Internal Medicine

## 2018-04-24 ENCOUNTER — Ambulatory Visit
Admission: RE | Admit: 2018-04-24 | Discharge: 2018-04-24 | Disposition: A | Discharge status: Home / Self Care | Payer: BC Managed Care – PPO | Source: Ambulatory Visit | Attending: Internal Medicine | Admitting: Internal Medicine

## 2018-04-24 DIAGNOSIS — N6320 Unspecified lump in the left breast, unspecified quadrant: Principal | ICD-10-CM

## 2018-04-24 DIAGNOSIS — N6310 Unspecified lump in the right breast, unspecified quadrant: Secondary | ICD-10-CM

## 2018-04-30 ENCOUNTER — Ambulatory Visit
Admission: RE | Admit: 2018-04-30 | Discharge: 2018-04-30 | Disposition: A | Discharge status: Home / Self Care | Payer: BC Managed Care – PPO | Source: Ambulatory Visit | Attending: Internal Medicine | Admitting: Internal Medicine

## 2018-04-30 DIAGNOSIS — N6320 Unspecified lump in the left breast, unspecified quadrant: Principal | ICD-10-CM

## 2018-04-30 DIAGNOSIS — N6310 Unspecified lump in the right breast, unspecified quadrant: Secondary | ICD-10-CM

## 2018-04-30 HISTORY — PX: BREAST BIOPSY: SHX20

## 2019-03-12 ENCOUNTER — Other Ambulatory Visit: Payer: Self-pay | Admitting: Internal Medicine

## 2019-03-12 DIAGNOSIS — N6310 Unspecified lump in the right breast, unspecified quadrant: Secondary | ICD-10-CM

## 2019-03-20 ENCOUNTER — Ambulatory Visit
Admission: RE | Admit: 2019-03-20 | Discharge: 2019-03-20 | Disposition: A | Discharge status: Home / Self Care | Payer: BC Managed Care – PPO | Source: Ambulatory Visit | Attending: Internal Medicine | Admitting: Internal Medicine

## 2019-03-20 ENCOUNTER — Other Ambulatory Visit: Payer: Self-pay

## 2019-03-20 DIAGNOSIS — N6310 Unspecified lump in the right breast, unspecified quadrant: Secondary | ICD-10-CM

## 2019-03-25 ENCOUNTER — Other Ambulatory Visit: Payer: Self-pay | Admitting: Surgery

## 2019-04-29 NOTE — Pre-Procedure Instructions (Signed)
[id number] [address], [place] [address] Phone: [phone] Fax: [phone]  [facility] [address], [place]. [place]. [place] [address] Phone: [phone] Fax: [phone]      Your procedure is scheduled on Thursday, March 18th.  Report to [facility] Main Entrance "A" at 5:30 A.M., and check in at the Admitting office.  Call this number if you have problems the morning of surgery:  [phone]  Call [phone] if you have any questions prior to your surgery date Monday-Friday 8am-4pm    Remember:  Do not eat after midnight the night before your surgery  You may drink clear liquids until 4:30 a.m. the morning of your surgery.   Clear liquids allowed are: Water, Non-Citrus Juices (without pulp), Carbonated Beverages, Clear Tea, Black Coffee Only, and Gatorade    There are no medications that you need to take on the morning of surgery.   Patient Instructions  . The night before surgery:  o No food after midnight. ONLY clear liquids after midnight  .  Marland Kitchen The day of surgery (if you do NOT have diabetes):  o Drink ONE (1) Pre-Surgery Clear Ensure as directed.   o This drink was given to you during your hospital  pre-op appointment visit. o The pre-op nurse will instruct you on the time to drink the  Pre-Surgery Ensure depending on your surgery time. o Finish the drink at the designated time by the pre-op nurse.  o Nothing else to drink after completing the  Pre-Surgery Clear Ensure   7 days prior to surgery STOP taking any Aspirin (unless otherwise instructed by your surgeon), Aleve, Naproxen, Ibuprofen, Motrin, Advil, Goody's, BC's, all herbal medications, fish oil, and all vitamins.    The Morning of Surgery  Do not wear jewelry, make-up or nail polish.  Do not wear lotions, powders, or perfumes, or deodorant  Do not shave 48 hours prior to  surgery.    Do not bring valuables to the hospital.  [facility] is not responsible for any belongings or valuables.  If you are a smoker, DO NOT Smoke 24 hours prior to surgery  If you wear a CPAP at night please bring your mask the morning of surgery   Remember that you must have someone to transport you home after your surgery, and remain with you for 24 hours if you are discharged the same day.   Please bring cases for contacts, glasses, hearing aids, dentures or bridgework because it cannot be worn into surgery.    Leave your suitcase in the car.  After surgery it may be brought to your room.  For patients admitted to the hospital, discharge time will be determined by your treatment team.  Patients discharged the day of surgery will not be allowed to drive home.    Special instructions:   - Preparing For Surgery  Before surgery, you can play an important role. Because skin is not sterile, your skin needs to be as free of germs as possible. You can reduce the number of germs on your skin by washing with CHG (chlorahexidine gluconate) Soap before surgery.  CHG is an antiseptic cleaner which kills germs and bonds with the skin to continue killing germs even after washing.    Oral Hygiene is also important to reduce your risk of infection.  Remember - BRUSH YOUR TEETH THE MORNING OF SURGERY WITH YOUR REGULAR TOOTHPASTE  Please do not use if you have an allergy to CHG or antibacterial soaps. If your skin becomes reddened/irritated stop using the CHG.  Do not shave (including legs and underarms) for at least 48 hours prior to first CHG shower. It is OK to shave your face.  Please follow these instructions carefully.   1. Shower the NIGHT BEFORE SURGERY and the MORNING OF SURGERY with CHG Soap.   2. If you chose to wash your hair, wash your hair first as usual with your normal shampoo.  3. After you shampoo, rinse your hair and body thoroughly to remove the  shampoo.  4. Use CHG as you would any other liquid soap. You can apply CHG directly to the skin and wash gently with a scrungie or a clean washcloth.   5. Apply the CHG Soap to your body ONLY FROM THE NECK DOWN.  Do not use on open wounds or open sores. Avoid contact with your eyes, ears, mouth and genitals (private parts). Wash Face and genitals (private parts)  with your normal soap.   6. Wash thoroughly, paying special attention to the area where your surgery will be performed.  7. Thoroughly rinse your body with warm water from the neck down.  8. DO NOT shower/wash with your normal soap after using and rinsing off the CHG Soap.  9. Pat yourself dry with a CLEAN TOWEL.  10. Wear CLEAN PAJAMAS to bed the night before surgery, wear comfortable clothes the morning of surgery  11. Place CLEAN SHEETS on your bed the night of your first shower and DO NOT SLEEP WITH PETS.    Day of Surgery:  Please shower the morning of surgery with the CHG soap Do not apply any deodorants/lotions. Please wear clean clothes to the hospital/surgery center.   Remember to brush your teeth WITH YOUR REGULAR TOOTHPASTE.   Please read over the following fact sheets that you were given.

## 2019-04-30 ENCOUNTER — Encounter (HOSPITAL_COMMUNITY)
Admission: RE | Admit: 2019-04-30 | Discharge: 2019-04-30 | Disposition: A | Discharge status: Home / Self Care | Payer: BC Managed Care – PPO | Source: Ambulatory Visit | Attending: Surgery | Admitting: Surgery

## 2019-04-30 ENCOUNTER — Other Ambulatory Visit: Payer: Self-pay

## 2019-04-30 ENCOUNTER — Encounter (HOSPITAL_COMMUNITY): Payer: Self-pay

## 2019-04-30 DIAGNOSIS — Z01812 Encounter for preprocedural laboratory examination: Secondary | ICD-10-CM | POA: Diagnosis present

## 2019-04-30 HISTORY — DX: Unspecified asthma, uncomplicated: J45.909

## 2019-04-30 LAB — CBC
HCT: 37.1 % (ref 36.0–46.0)
Hemoglobin: 12.1 g/dL (ref 12.0–15.0)
MCH: 29.4 pg (ref 26.0–34.0)
MCHC: 32.6 g/dL (ref 30.0–36.0)
MCV: 90.3 fL (ref 80.0–100.0)
Platelets: 317 10*3/uL (ref 150–400)
RBC: 4.11 MIL/uL (ref 3.87–5.11)
RDW: 12.2 % (ref 11.5–15.5)
WBC: 7 10*3/uL (ref 4.0–10.5)
nRBC: 0 % (ref 0.0–0.2)

## 2019-04-30 NOTE — Progress Notes (Signed)
PCP - Jilda Panda, MD Cardiologist - pt denies   Chest x-ray - n/a EKG - n/a Stress Test - pt denies ECHO - pt denies Cardiac Cath - pt denies  ERAS Protcol - yes  PRE-SURGERY Ensure or G2- ensure  COVID TEST- per pt, scheduled for 05/05/19 - pt verbalized understanding of self-quarantine after testing.    Anesthesia review: n/a  Patient denies shortness of breath, fever, cough and chest pain at PAT appointment   All instructions explained to the patient, with a verbal understanding of the material. Patient agrees to go over the instructions while at home for a better understanding. Patient also instructed to self quarantine after being tested for COVID-19. The opportunity to ask questions was provided.

## 2019-05-05 ENCOUNTER — Other Ambulatory Visit (HOSPITAL_COMMUNITY)
Admission: RE | Admit: 2019-05-05 | Discharge: 2019-05-05 | Disposition: A | Discharge status: Home / Self Care | Payer: BC Managed Care – PPO | Source: Ambulatory Visit | Attending: Surgery | Admitting: Surgery

## 2019-05-05 DIAGNOSIS — Z01812 Encounter for preprocedural laboratory examination: Secondary | ICD-10-CM | POA: Diagnosis present

## 2019-05-05 DIAGNOSIS — Z20822 Contact with and (suspected) exposure to covid-19: Secondary | ICD-10-CM | POA: Diagnosis not present

## 2019-05-05 LAB — SARS CORONAVIRUS 2 (TAT 6-24 HRS): SARS Coronavirus 2: NEGATIVE

## 2019-05-07 NOTE — H&P (Signed)
Erin Morton  Location: [facility] Patient #: [id number] DOB: [birth year] Single / Language: Cleophus Molt / Race: Black or African American Female   History of Present Illness  The patient is a 44 year old female who presents with a breast mass. This is a pleasant 44 year old female referred for a mass in her right breast. 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 She developed this breast mass over a year ago. She had a biopsy in March of last year showing a fibroadenoma. The mass, however, has been increasing in size and now causing discomfort in the right breast. By ultrasound, the mass is increased from 3 cm to 4 cm in size. She also has a family history of breast cancer in several paternal aunts. She is interested in having this mass removed. She is otherwise healthy without complaints.   Past Surgical History  Breast Biopsy  Right. Hysterectomy (not due to cancer) - Partial  Oral Surgery   Diagnostic Studies History  Colonoscopy  never Mammogram  within last year Pap Smear  >5 years ago   No Known Drug Allergies [03/25/2019]:  Medication History No Current Medications Medications Reconciled  Social History  Alcohol use  Occasional alcohol use. Caffeine use  Tea. Tobacco use  Never smoker.  Family History Cancer  Father. Diabetes Mellitus  Father, Mother. Hypertension  Mother. Migraine Headache  Mother.  Pregnancy / Birth History  Age at menarche  73 years. Gravida  3 Irregular periods  Maternal age  64-20 Para  3  Other Problems  Asthma  Gastroesophageal Reflux Disease     Review of Systems General Present- Night Sweats. Not Present- Appetite Loss, Chills, Fatigue, Fever, Weight Gain and Weight Loss. Skin Not Present- Change in Wart/Mole, Dryness, Hives, Jaundice, New Lesions, Non-Healing Wounds, Rash and Ulcer. HEENT Not Present- Earache, Hearing Loss, Hoarseness, Nose Bleed, Oral  Ulcers, Ringing in the Ears, Seasonal Allergies, Sinus Pain, Sore Throat, Visual Disturbances, Wears glasses/contact lenses and Yellow Eyes. Respiratory Not Present- Bloody sputum, Chronic Cough, Difficulty Breathing, Snoring and Wheezing. Breast Present- Breast Mass. Not Present- Breast Pain, Nipple Discharge and Skin Changes. Female Genitourinary Not Present- Frequency, Nocturia, Painful Urination, Pelvic Pain and Urgency. Musculoskeletal Not Present- Back Pain, Joint Pain, Joint Stiffness, Muscle Pain, Muscle Weakness and Swelling of Extremities. Neurological Not Present- Decreased Memory, Fainting, Headaches, Numbness, Seizures, Tingling, Tremor, Trouble walking and Weakness. Psychiatric Present- Change in Sleep Pattern. Not Present- Anxiety, Bipolar, Depression, Fearful and Frequent crying. Endocrine Not Present- Cold Intolerance, Excessive Hunger, Hair Changes, Heat Intolerance, Hot flashes and New Diabetes. Hematology Not Present- Blood Thinners, Easy Bruising, Excessive bleeding, Gland problems, HIV and Persistent Infections.  Vitals  Weight: 193 lb Height: 67in Body Surface Area: 1.99 m Body Mass Index: 30.23 kg/m  BP: 118/80 (Sitting, Left Arm, Standard)    Physical Exam  The physical exam findings are as follows: Note:On exam, she appears well.  There is a moderate size, 4 cm mass in the right breast at 4 o'clock position several centimeters from the nipple. It is mobile. It is mildly tender. There are no skin changes. There are no other palpable masses.  I reviewed her mammograms, ultrasound, and previous pathology results.    Assessment & Plan   BREAST MASS, RIGHT (N63.10)  Impression: This is a right breast mass is increasing in size. Although it is been shown to be a fibroadenoma in the past, surgical excision is recommended for complete histologic evaluation to  rule out a malignancy or sarcoma given the increase in size. She is receiving surgery. We discussed  the diagnosis preoperatively in detail. I discussed a right breast lumpectomy with her in detail. I discussed the risk which includes but is not limited to bleeding, infection, need for further surgery if malignancy is found, postoperative recovery, cardiopulmonary issues, etc. Surgery will be scheduled

## 2019-05-08 ENCOUNTER — Encounter (HOSPITAL_COMMUNITY): Payer: Self-pay | Admitting: Surgery

## 2019-05-08 ENCOUNTER — Ambulatory Visit (HOSPITAL_COMMUNITY): Payer: BC Managed Care – PPO | Admitting: Certified Registered"

## 2019-05-08 ENCOUNTER — Encounter (HOSPITAL_COMMUNITY)
Admission: RE | Disposition: A | Discharge status: Home / Self Care | Payer: Self-pay | Source: Home / Self Care | Attending: Surgery

## 2019-05-08 ENCOUNTER — Other Ambulatory Visit: Payer: Self-pay

## 2019-05-08 ENCOUNTER — Ambulatory Visit (HOSPITAL_COMMUNITY)
Admission: RE | Admit: 2019-05-08 | Discharge: 2019-05-08 | Disposition: A | Discharge status: Home / Self Care | Payer: BC Managed Care – PPO | Attending: Surgery | Admitting: Surgery

## 2019-05-08 DIAGNOSIS — D241 Benign neoplasm of right breast: Secondary | ICD-10-CM | POA: Insufficient documentation

## 2019-05-08 DIAGNOSIS — J45909 Unspecified asthma, uncomplicated: Secondary | ICD-10-CM | POA: Insufficient documentation

## 2019-05-08 DIAGNOSIS — N6310 Unspecified lump in the right breast, unspecified quadrant: Secondary | ICD-10-CM | POA: Diagnosis present

## 2019-05-08 HISTORY — PX: BREAST LUMPECTOMY: SHX2

## 2019-05-08 SURGERY — BREAST LUMPECTOMY
Anesthesia: General | Site: Breast | Laterality: Right

## 2019-05-08 MED ORDER — FENTANYL CITRATE (PF) 100 MCG/2ML IJ SOLN: 25.0000 ug | INTRAMUSCULAR | Status: DC | PRN

## 2019-05-08 MED ORDER — MIDAZOLAM HCL 5 MG/5ML IJ SOLN
INTRAMUSCULAR | Status: DC | PRN
  Administered 2019-05-08: 2 mg via INTRAVENOUS

## 2019-05-08 MED ORDER — BUPIVACAINE HCL (PF) 0.25 % IJ SOLN
INTRAMUSCULAR | Status: AC
  Filled 2019-05-08: qty 30

## 2019-05-08 MED ORDER — ONDANSETRON HCL 4 MG/2ML IJ SOLN: 4.0000 mg | Freq: Four times a day (QID) | INTRAMUSCULAR | Status: DC | PRN

## 2019-05-08 MED ORDER — ONDANSETRON HCL 4 MG/2ML IJ SOLN
INTRAMUSCULAR | Status: AC
  Filled 2019-05-08: qty 2

## 2019-05-08 MED ORDER — CEFAZOLIN SODIUM-DEXTROSE 2-4 GM/100ML-% IV SOLN
2.0000 g | INTRAVENOUS | Status: AC
  Administered 2019-05-08: 2 g via INTRAVENOUS
  Filled 2019-05-08: qty 100

## 2019-05-08 MED ORDER — CELECOXIB 200 MG PO CAPS
400.0000 mg | ORAL_CAPSULE | ORAL | Status: AC
  Administered 2019-05-08: 400 mg via ORAL
  Filled 2019-05-08: qty 2

## 2019-05-08 MED ORDER — 0.9 % SODIUM CHLORIDE (POUR BTL) OPTIME
TOPICAL | Status: DC | PRN
  Administered 2019-05-08: 1000 mL

## 2019-05-08 MED ORDER — ONDANSETRON HCL 4 MG/2ML IJ SOLN
INTRAMUSCULAR | Status: DC | PRN
  Administered 2019-05-08: 4 mg via INTRAVENOUS

## 2019-05-08 MED ORDER — OXYCODONE HCL 5 MG PO TABS: 5.0000 mg | ORAL_TABLET | Freq: Once | ORAL | Status: DC | PRN

## 2019-05-08 MED ORDER — HYDROCODONE-ACETAMINOPHEN 5-325 MG PO TABS: 1.0000 | ORAL_TABLET | Freq: Four times a day (QID) | ORAL | 0 refills | Status: AC | PRN

## 2019-05-08 MED ORDER — FENTANYL CITRATE (PF) 250 MCG/5ML IJ SOLN
INTRAMUSCULAR | Status: DC | PRN
  Administered 2019-05-08: 100 ug via INTRAVENOUS
  Administered 2019-05-08: 25 ug via INTRAVENOUS

## 2019-05-08 MED ORDER — ENSURE PRE-SURGERY PO LIQD: 296.0000 mL | Freq: Once | ORAL | Status: DC

## 2019-05-08 MED ORDER — MIDAZOLAM HCL 2 MG/2ML IJ SOLN
INTRAMUSCULAR | Status: AC
  Filled 2019-05-08: qty 2

## 2019-05-08 MED ORDER — ACETAMINOPHEN 500 MG PO TABS
1000.0000 mg | ORAL_TABLET | ORAL | Status: AC
  Administered 2019-05-08: 1000 mg via ORAL
  Filled 2019-05-08: qty 2

## 2019-05-08 MED ORDER — FENTANYL CITRATE (PF) 250 MCG/5ML IJ SOLN
INTRAMUSCULAR | Status: AC
  Filled 2019-05-08: qty 5

## 2019-05-08 MED ORDER — CHLORHEXIDINE GLUCONATE CLOTH 2 % EX PADS: 6.0000 | MEDICATED_PAD | Freq: Once | CUTANEOUS | Status: DC

## 2019-05-08 MED ORDER — DEXAMETHASONE SODIUM PHOSPHATE 10 MG/ML IJ SOLN
INTRAMUSCULAR | Status: DC | PRN
  Administered 2019-05-08: 4 mg via INTRAVENOUS

## 2019-05-08 MED ORDER — LACTATED RINGERS IV SOLN: INTRAVENOUS | Status: DC

## 2019-05-08 MED ORDER — OXYCODONE HCL 5 MG/5ML PO SOLN: 5.0000 mg | Freq: Once | ORAL | Status: DC | PRN

## 2019-05-08 MED ORDER — GABAPENTIN 300 MG PO CAPS
300.0000 mg | ORAL_CAPSULE | ORAL | Status: AC
  Administered 2019-05-08: 300 mg via ORAL
  Filled 2019-05-08: qty 1

## 2019-05-08 MED ORDER — PROPOFOL 10 MG/ML IV BOLUS
INTRAVENOUS | Status: AC
  Filled 2019-05-08: qty 40

## 2019-05-08 MED ORDER — DEXAMETHASONE SODIUM PHOSPHATE 10 MG/ML IJ SOLN
INTRAMUSCULAR | Status: AC
  Filled 2019-05-08: qty 1

## 2019-05-08 MED ORDER — LIDOCAINE 2% (20 MG/ML) 5 ML SYRINGE
INTRAMUSCULAR | Status: DC | PRN
  Administered 2019-05-08: 60 mg via INTRAVENOUS

## 2019-05-08 MED ORDER — PROPOFOL 10 MG/ML IV BOLUS
INTRAVENOUS | Status: DC | PRN
  Administered 2019-05-08: 180 mg via INTRAVENOUS

## 2019-05-08 MED ORDER — BUPIVACAINE-EPINEPHRINE 0.25% -1:200000 IJ SOLN
INTRAMUSCULAR | Status: DC | PRN
  Administered 2019-05-08: 20 mL

## 2019-05-08 SURGICAL SUPPLY — 36 items
BINDER BREAST LRG (GAUZE/BANDAGES/DRESSINGS) ×3 IMPLANT
BINDER BREAST XLRG (GAUZE/BANDAGES/DRESSINGS) IMPLANT
CANISTER SUCT 3000ML PPV (MISCELLANEOUS) IMPLANT
CHLORAPREP W/TINT 26 (MISCELLANEOUS) ×3 IMPLANT
CNTNR URN SCR LID CUP LEK RST (MISCELLANEOUS) IMPLANT
CONT SPEC 4OZ STRL OR WHT (MISCELLANEOUS)
COVER SURGICAL LIGHT HANDLE (MISCELLANEOUS) ×3 IMPLANT
COVER WAND RF STERILE (DRAPES) ×3 IMPLANT
DECANTER SPIKE VIAL GLASS SM (MISCELLANEOUS) ×3 IMPLANT
DERMABOND ADVANCED (GAUZE/BANDAGES/DRESSINGS) ×2
DERMABOND ADVANCED .7 DNX12 (GAUZE/BANDAGES/DRESSINGS) ×1 IMPLANT
DEVICE DUBIN SPECIMEN MAMMOGRA (MISCELLANEOUS) IMPLANT
DRAPE LAPAROTOMY 100X72 PEDS (DRAPES) ×3 IMPLANT
ELECT REM PT RETURN 9FT ADLT (ELECTROSURGICAL) ×3
ELECTRODE REM PT RTRN 9FT ADLT (ELECTROSURGICAL) ×1 IMPLANT
GAUZE 4X4 16PLY RFD (DISPOSABLE) ×3 IMPLANT
GAUZE SPONGE 4X4 12PLY STRL LF (GAUZE/BANDAGES/DRESSINGS) ×3 IMPLANT
GLOVE SURG SIGNA 7.5 PF LTX (GLOVE) ×3 IMPLANT
GOWN STRL REUS W/ TWL LRG LVL3 (GOWN DISPOSABLE) ×1 IMPLANT
GOWN STRL REUS W/ TWL XL LVL3 (GOWN DISPOSABLE) ×1 IMPLANT
GOWN STRL REUS W/TWL LRG LVL3 (GOWN DISPOSABLE) ×3
GOWN STRL REUS W/TWL XL LVL3 (GOWN DISPOSABLE) ×3
KIT BASIN OR (CUSTOM PROCEDURE TRAY) ×3 IMPLANT
KIT MARKER MARGIN INK (KITS) IMPLANT
KIT TURNOVER KIT B (KITS) ×3 IMPLANT
NEEDLE HYPO 25GX1X1/2 BEV (NEEDLE) ×3 IMPLANT
NS IRRIG 1000ML POUR BTL (IV SOLUTION) ×3 IMPLANT
PACK GENERAL/GYN (CUSTOM PROCEDURE TRAY) ×3 IMPLANT
PAD ARMBOARD 7.5X6 YLW CONV (MISCELLANEOUS) ×3 IMPLANT
PENCIL SMOKE EVACUATOR (MISCELLANEOUS) ×3 IMPLANT
SUT MNCRL AB 4-0 PS2 18 (SUTURE) ×3 IMPLANT
SUT VIC AB 3-0 SH 27 (SUTURE) ×3
SUT VIC AB 3-0 SH 27X BRD (SUTURE) ×1 IMPLANT
SYR CONTROL 10ML LL (SYRINGE) ×3 IMPLANT
TOWEL GREEN STERILE (TOWEL DISPOSABLE) ×3 IMPLANT
TOWEL GREEN STERILE FF (TOWEL DISPOSABLE) ×3 IMPLANT

## 2019-05-08 NOTE — Discharge Instructions (Signed)
[place] Office Phone Number [phone]  BREAST BIOPSY/ PARTIAL MASTECTOMY: POST OP INSTRUCTIONS  Always review your discharge instruction sheet given to you by the facility where your surgery was performed.  IF YOU HAVE DISABILITY OR FAMILY LEAVE FORMS, YOU MUST BRING THEM TO THE OFFICE FOR PROCESSING.  DO NOT GIVE THEM TO YOUR DOCTOR.  1. A prescription for pain medication may be given to you upon discharge.  Take your pain medication as prescribed, if needed.  If narcotic pain medicine is not needed, then you may take acetaminophen (Tylenol) or ibuprofen (Advil) as needed. 2. Take your usually prescribed medications unless otherwise directed 3. If you need a refill on your pain medication, please contact your pharmacy.  They will contact our office to request authorization.  Prescriptions will not be filled after 5pm or on week-ends. 4. You should eat very light the first 24 hours after surgery, such as soup, crackers, pudding, etc.  Resume your normal diet the day after surgery. 5. Most patients will experience some swelling and bruising in the breast.  Ice packs and a good support bra will help.  Swelling and bruising can take several days to resolve.  6. It is common to experience some constipation if taking pain medication after surgery.  Increasing fluid intake and taking a stool softener will usually help or prevent this problem from occurring.  A mild laxative (Milk of Magnesia or Miralax) should be taken according to package directions if there are no bowel movements after 48 hours. 7. Unless discharge instructions indicate otherwise, you may remove your bandages 24-48 hours after surgery, and you may shower at that time.  You may have steri-strips (small skin tapes) in place directly over the incision.  These strips should be left on the skin for 7-10 days.  If your surgeon used skin glue on the incision, you may shower in 24 hours.  The glue will flake off over the  next 2-3 weeks.  Any sutures or staples will be removed at the office during your follow-up visit. 8. ACTIVITIES:  You may resume regular daily activities (gradually increasing) beginning the next day.  Wearing a good support bra or sports bra minimizes pain and swelling.  You may have sexual intercourse when it is comfortable. a. You may drive when you no longer are taking prescription pain medication, you can comfortably wear a seatbelt, and you can safely maneuver your car and apply brakes. b. RETURN TO WORK:  _Tuesday MARCH 23RD _____________________________________________________________________________________ 9. You should see your doctor in the office for a follow-up appointment approximately two weeks after your surgery.  Your doctor's nurse will typically make your follow-up appointment when she calls you with your pathology report.  Expect your pathology report 2-3 business days after your surgery.  You may call to check if you do not hear from [place] after three days. 10. OTHER INSTRUCTIONS:OK TO SHOWER STARTING TOMORROW 11. ICE PACK, TYLENOL, IBUPROFEN ALSO FOR PAIN 12.  _______________________________________________________________________________________________ _____________________________________________________________________________________________________________________________________ _____________________________________________________________________________________________________________________________________ _____________________________________________________________________________________________________________________________________  WHEN TO CALL YOUR DOCTOR: 1. Fever over 101.0 2. Nausea and/or vomiting. 3. Extreme swelling or bruising. 4. Continued bleeding from incision. 5. Increased pain, redness, or drainage from the incision.  The clinic staff is available to answer your questions during regular business hours.  Please don't hesitate to call and ask to  speak to one of the nurses for clinical concerns.  If you have a medical emergency, go to the nearest emergency room or call 911.  A surgeon from [facility] is always on call at the hospital.  For further questions, please visit [email or web address]

## 2019-05-08 NOTE — Anesthesia Preprocedure Evaluation (Addendum)
Anesthesia Evaluation  Patient identified by MRN, date of birth, ID band Patient awake    Reviewed: Allergy & Precautions, H&P , NPO status , Patient's Chart, lab work & pertinent test results  Airway Mallampati: II   Neck ROM: full    Dental  (+) Teeth Intact, Dental Advidsory Given   Pulmonary shortness of breath, asthma ,    breath sounds clear to auscultation       Cardiovascular negative cardio ROS   Rhythm:regular Rate:Normal     Neuro/Psych  Headaches,    GI/Hepatic   Endo/Other    Renal/GU      Musculoskeletal   Abdominal   Peds  Hematology   Anesthesia Other Findings   Reproductive/Obstetrics Breast mass                            Anesthesia Physical Anesthesia Plan  ASA: II  Anesthesia Plan: General   Post-op Pain Management:    Induction: Intravenous  PONV Risk Score and Plan: 3 and Ondansetron, Dexamethasone, Midazolam and Treatment may vary due to age or medical condition  Airway Management Planned: LMA  Additional Equipment:   Intra-op Plan:   Post-operative Plan:   Informed Consent: I have reviewed the patients History and Physical, chart, labs and discussed the procedure including the risks, benefits and alternatives for the proposed anesthesia with the patient or authorized representative who has indicated his/her understanding and acceptance.       Plan Discussed with: CRNA, Anesthesiologist and Surgeon  Anesthesia Plan Comments:         Anesthesia Quick Evaluation

## 2019-05-08 NOTE — Op Note (Signed)
RIGHT BREAST LUMPECTOMY  Procedure Note  Erin Morton 05/08/2019   Pre-op Diagnosis: RIGHT BREAST MASS     Post-op Diagnosis: same  Procedure(s): RIGHT BREAST LUMPECTOMY  Surgeon(s): Coralie Keens, MD  Anesthesia: General  Staff:  Circulator: Cyd Silence, RN Scrub Person: Caswell Corwin T  Estimated Blood Loss: Minimal               Specimens: sent to path         Indications: This is a 44 year old female with enlarging right breast mass.  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  It has, however, increased from 2 cm to over  3 cm in size so surgical excision has been recommended  Procedure: The patient was brought to the operating room and identifies the correct patient.  He was placed upon the operating table and general anesthesia was induced.  Her right breast was prepped and draped in usual sterile fashion.  The mass was located at the 4 o'clock position of the right breast.  I anesthetized the skin at the edge of the areola with Marcaine.  I then made a circumareolar incision with a scalpel.  I then dissected medially toward the palpable breast mass with cautery.  I identified the large mass and excised in its entirety with the cautery.  The mass was sent to pathology for evaluation.  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  Hemostasis was achieved with the cautery.  I anesthetized the incision further with Marcaine.  I then closed the subcutaneous tissue with interrupted 3-0 Vicryl sutures and closed the skin with a running 4-0 Monocryl.  Dermabond was then applied.  The patient tolerated the procedure well.  All the counts were correct at the end of the procedure.  The patient was then extubated in the operating room and taken in a stable condition to the recovery room. Coralie Keens   Date: 05/08/2019  Time: 7:47 AM

## 2019-05-08 NOTE — Anesthesia Procedure Notes (Signed)
Procedure Name: LMA Insertion Date/Time: 05/08/2019 7:22 AM Performed by: Colin Benton, CRNA Pre-anesthesia Checklist: Emergency Drugs available, Patient identified, Suction available and Patient being monitored Patient Re-evaluated:Patient Re-evaluated prior to induction Oxygen Delivery Method: Circle system utilized Preoxygenation: Pre-oxygenation with 100% oxygen Induction Type: IV induction Ventilation: Mask ventilation without difficulty LMA: LMA inserted LMA Size: 4.0 Number of attempts: 1 Placement Confirmation: positive ETCO2 and breath sounds checked- equal and bilateral Tube secured with: Tape Dental Injury: Teeth and Oropharynx as per pre-operative assessment

## 2019-05-08 NOTE — Anesthesia Postprocedure Evaluation (Signed)
Anesthesia Post Note  Patient: Erin Morton  Procedure(s) Performed: RIGHT BREAST LUMPECTOMY (Right Breast)     Patient location during evaluation: PACU Anesthesia Type: General Level of consciousness: awake and alert Pain management: pain level controlled Vital Signs Assessment: post-procedure vital signs reviewed and stable Respiratory status: spontaneous breathing, nonlabored ventilation, respiratory function stable and patient connected to nasal cannula oxygen Cardiovascular status: blood pressure returned to baseline and stable Postop Assessment: no apparent nausea or vomiting Anesthetic complications: no    Last Vitals:  Vitals:   05/08/19 0819 05/08/19 0820  BP:  115/78  Pulse: 78 66  Resp: (!) 22 18  Temp:  36.7 C  SpO2: 100% 99%    Last Pain:  Vitals:   05/08/19 0815  TempSrc:   PainSc: 0-No pain                 Keirsten Matuska S

## 2019-05-08 NOTE — Interval H&P Note (Signed)
History and Physical Interval Note:no change in H and P  05/08/2019 7:10 AM  Erin Morton  has presented today for surgery, with the diagnosis of RIGHT BREAST MASS.  The various methods of treatment have been discussed with the patient and family. After consideration of risks, benefits and other options for treatment, the patient has consented to  Procedure(s) with comments: RIGHT BREAST LUMPECTOMY (Right) - LMA as a surgical intervention.  The patient's history has been reviewed, patient examined, no change in status, stable for surgery.  I have reviewed the patient's chart and labs.  Questions were answered to the patient's satisfaction.     Coralie Keens

## 2019-05-08 NOTE — Transfer of Care (Signed)
Immediate Anesthesia Transfer of Care Note  Patient: Erin Morton  Procedure(s) Performed: RIGHT BREAST LUMPECTOMY (Right Breast)  Patient Location: PACU  Anesthesia Type:General  Level of Consciousness: drowsy and patient cooperative  Airway & Oxygen Therapy: Patient Spontanous Breathing and Patient connected to nasal cannula oxygen  Post-op Assessment: Report given to RN and Post -op Vital signs reviewed and stable  Post vital signs: Reviewed and stable  Last Vitals:  Vitals Value Taken Time  BP 115/88 05/08/19 0752  Temp    Pulse 78 05/08/19 0753  Resp 14 05/08/19 0753  SpO2 98 % 05/08/19 0753  Vitals shown include unvalidated device data.  Last Pain:  Vitals:   05/08/19 0603  TempSrc:   PainSc: 0-No pain      Patients Stated Pain Goal: 3 ([id number])  Complications: No apparent anesthesia complications

## 2019-05-09 LAB — SURGICAL PATHOLOGY

## 2019-12-17 IMAGING — MG MM CLIP PLACEMENT
2 series · 2 of 2 positions shown · non-contrast
Comparison: Previous exam(s).

CLINICAL DATA: Post biopsy mammogram of the right breast for clip
placement.

EXAM:
DIAGNOSTIC RIGHT MAMMOGRAM POST ULTRASOUND BIOPSY

[R ML]
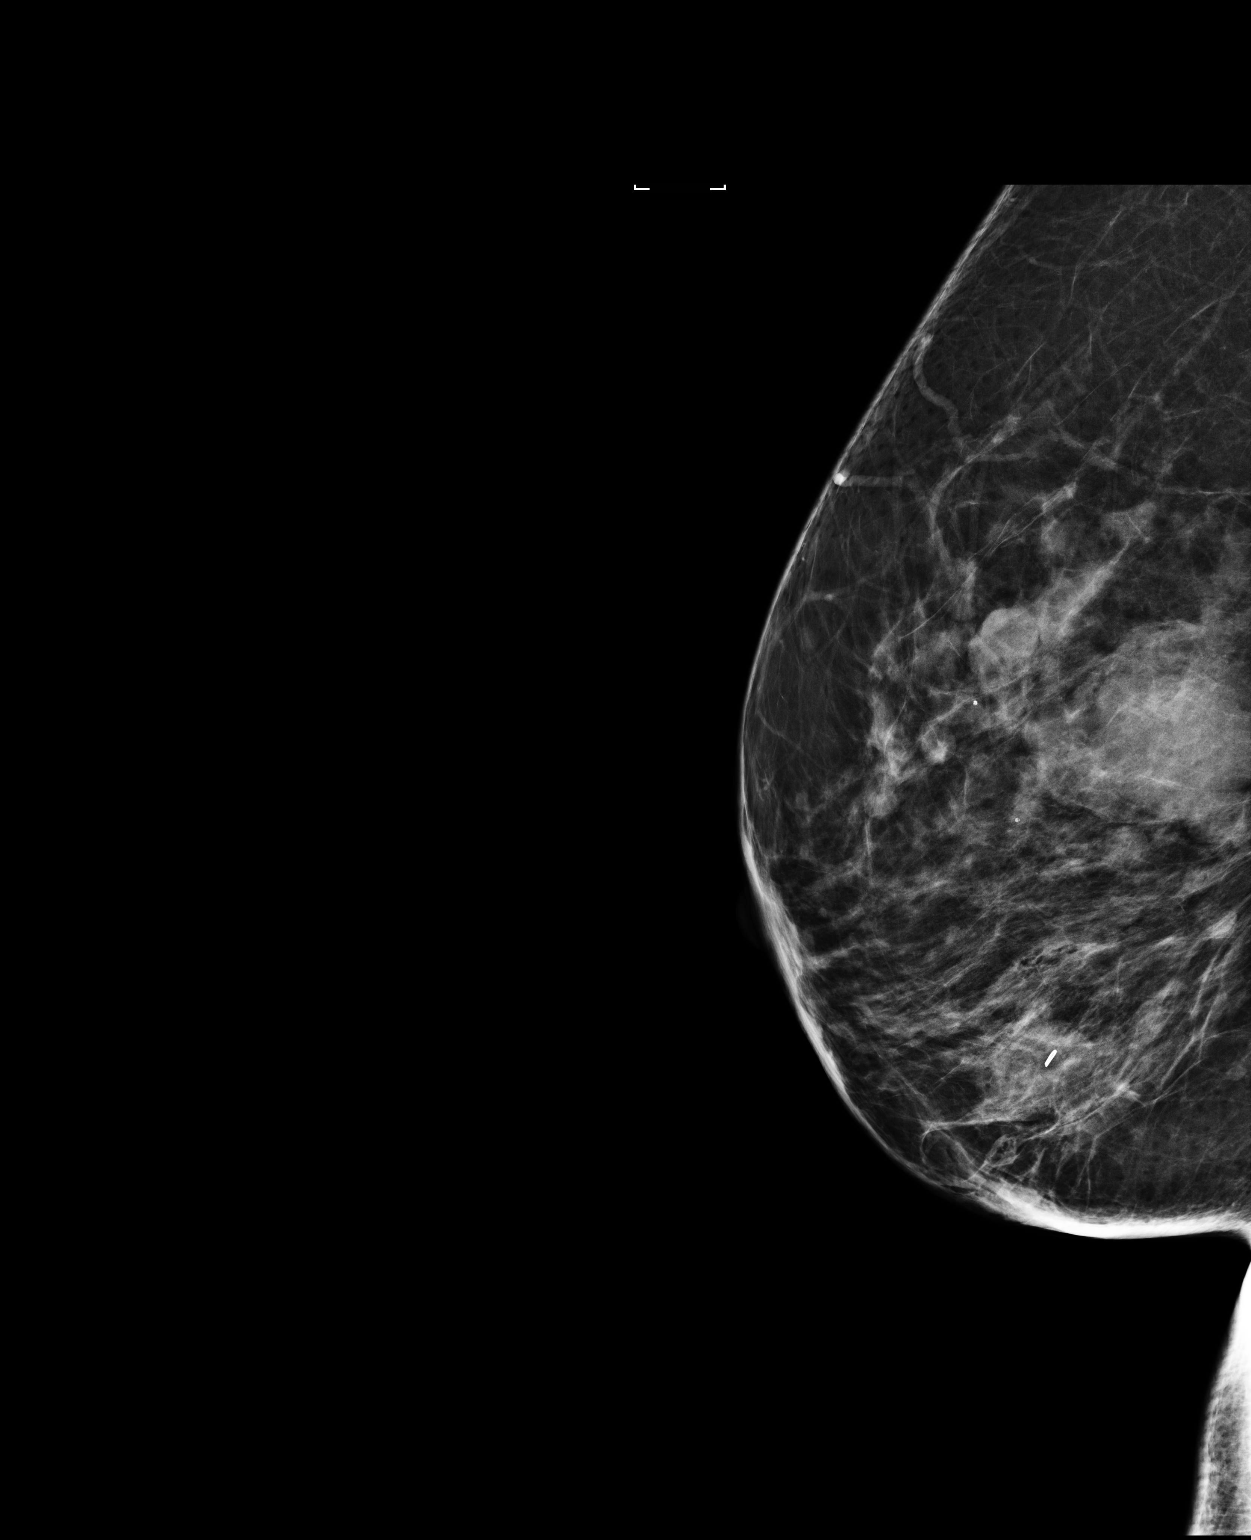

[R CC]
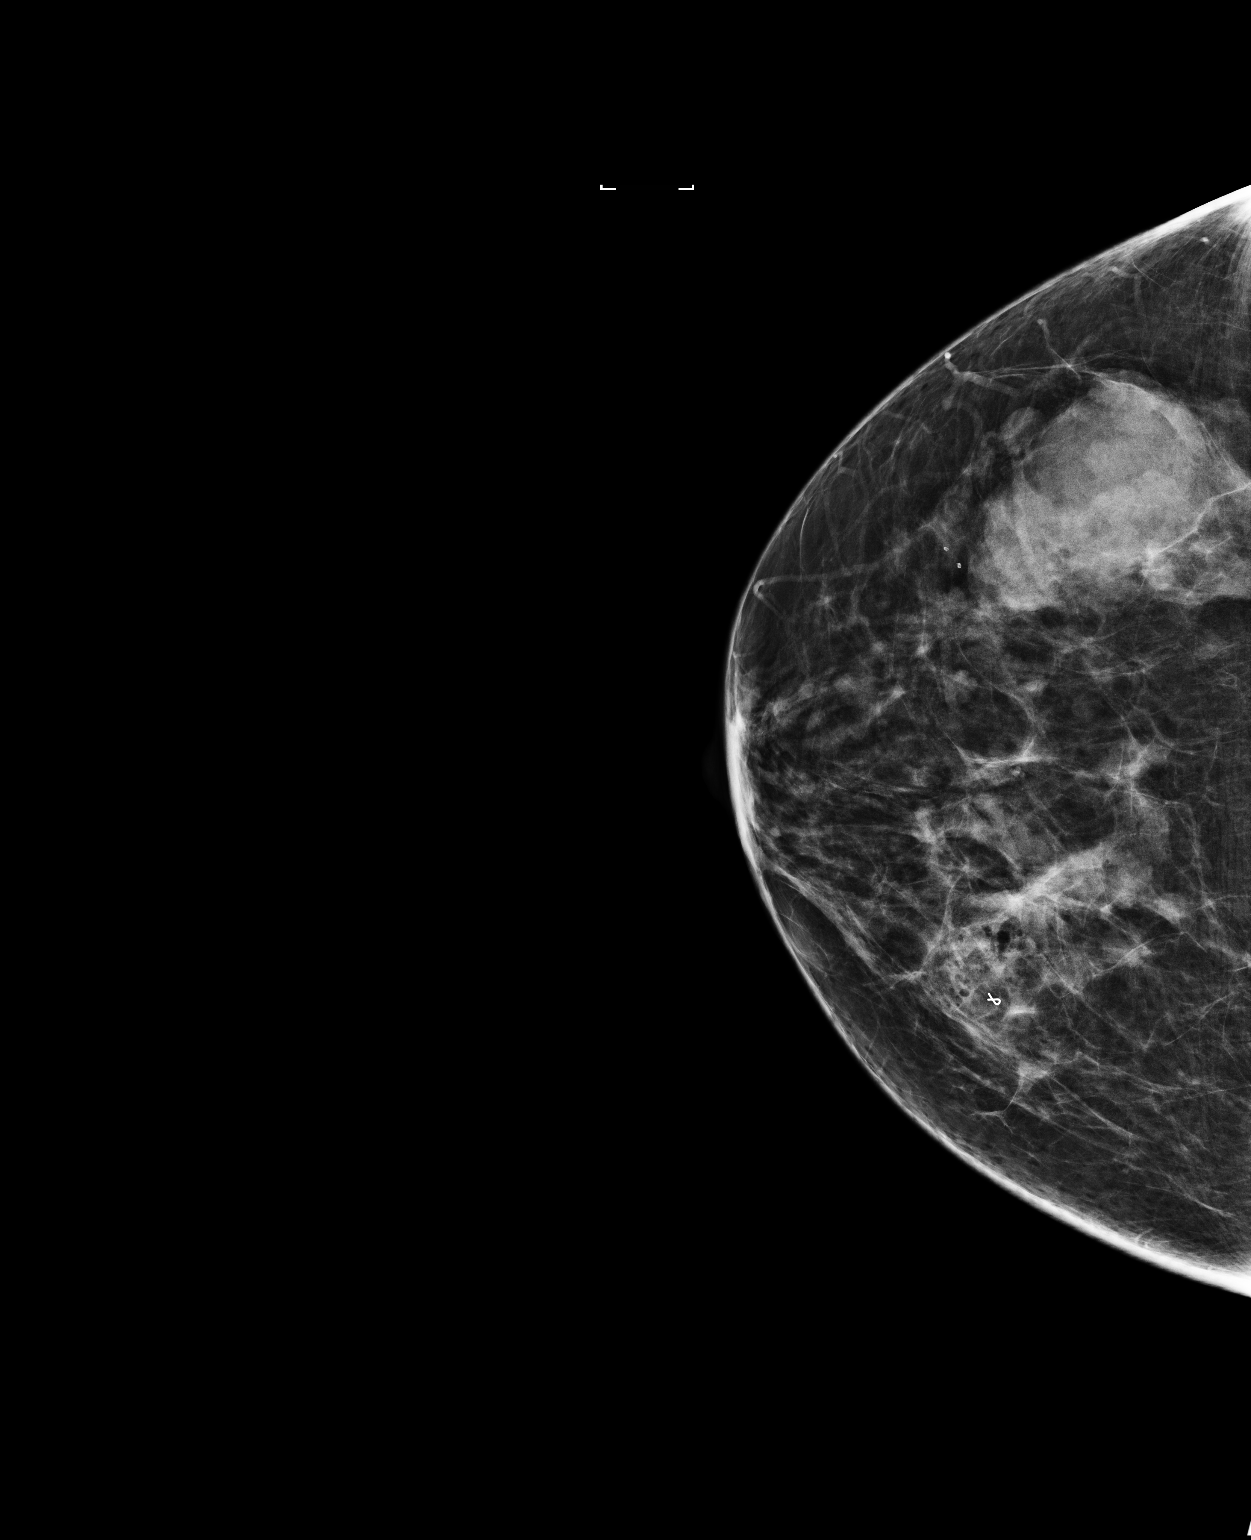

[2 of 2 positions shown; findings below may reference images not displayed]

FINDINGS: Mammographic images were obtained following ultrasound guided biopsy
of a right breast mass at 4 o'clock. The ribbon shaped biopsy
marking clip is well positioned at the site of biopsy in the
lower-inner right breast.
IMPRESSION: Appropriate positioning of the ribbon shaped biopsy marking clip in
the lower-inner right breast.

Final Assessment: Post Procedure Mammograms for Marker Placement

## 2021-10-12 ENCOUNTER — Other Ambulatory Visit: Payer: Self-pay | Admitting: Internal Medicine

## 2021-10-12 DIAGNOSIS — N644 Mastodynia: Secondary | ICD-10-CM

## 2021-10-13 ENCOUNTER — Other Ambulatory Visit: Payer: Self-pay | Admitting: Internal Medicine

## 2021-10-13 DIAGNOSIS — N644 Mastodynia: Secondary | ICD-10-CM

## 2021-10-13 DIAGNOSIS — N6310 Unspecified lump in the right breast, unspecified quadrant: Secondary | ICD-10-CM

## 2021-11-02 ENCOUNTER — Ambulatory Visit
Admission: RE | Admit: 2021-11-02 | Discharge: 2021-11-02 | Disposition: A | Discharge status: Home / Self Care | Payer: Commercial Managed Care - HMO | Source: Ambulatory Visit | Attending: Internal Medicine | Admitting: Internal Medicine

## 2021-11-02 ENCOUNTER — Ambulatory Visit
Admission: RE | Admit: 2021-11-02 | Discharge: 2021-11-02 | Disposition: A | Discharge status: Home / Self Care | Payer: BC Managed Care – PPO | Source: Ambulatory Visit | Attending: Internal Medicine | Admitting: Internal Medicine

## 2021-11-02 ENCOUNTER — Other Ambulatory Visit: Payer: Self-pay | Admitting: Internal Medicine

## 2021-11-02 DIAGNOSIS — N6310 Unspecified lump in the right breast, unspecified quadrant: Secondary | ICD-10-CM

## 2021-11-07 ENCOUNTER — Ambulatory Visit
Admission: RE | Admit: 2021-11-07 | Discharge: 2021-11-07 | Disposition: A | Discharge status: Home / Self Care | Payer: Commercial Managed Care - HMO | Source: Ambulatory Visit | Attending: Internal Medicine | Admitting: Internal Medicine

## 2021-11-07 DIAGNOSIS — N6310 Unspecified lump in the right breast, unspecified quadrant: Secondary | ICD-10-CM

## 2022-12-11 ENCOUNTER — Encounter: Payer: Self-pay | Admitting: Internal Medicine

## 2022-12-21 ENCOUNTER — Other Ambulatory Visit: Payer: Self-pay | Admitting: Internal Medicine

## 2022-12-21 DIAGNOSIS — Z1231 Encounter for screening mammogram for malignant neoplasm of breast: Secondary | ICD-10-CM

## 2023-01-12 ENCOUNTER — Ambulatory Visit
Admission: RE | Admit: 2023-01-12 | Discharge: 2023-01-12 | Disposition: A | Discharge status: Home / Self Care | Payer: BC Managed Care – PPO | Source: Ambulatory Visit | Attending: Internal Medicine | Admitting: Internal Medicine

## 2023-01-12 DIAGNOSIS — Z1231 Encounter for screening mammogram for malignant neoplasm of breast: Secondary | ICD-10-CM

## 2023-01-22 ENCOUNTER — Other Ambulatory Visit: Payer: Self-pay | Admitting: Internal Medicine

## 2023-01-22 DIAGNOSIS — N63 Unspecified lump in unspecified breast: Secondary | ICD-10-CM

## 2023-01-24 ENCOUNTER — Ambulatory Visit
Admission: RE | Admit: 2023-01-24 | Discharge: 2023-01-24 | Disposition: A | Discharge status: Home / Self Care | Payer: BC Managed Care – PPO | Source: Ambulatory Visit | Attending: Internal Medicine | Admitting: Internal Medicine

## 2023-01-24 ENCOUNTER — Inpatient Hospital Stay
Admission: RE | Admit: 2023-01-24 | Discharge: 2023-01-24 | Discharge status: Home / Self Care | Payer: BC Managed Care – PPO | Source: Ambulatory Visit | Attending: Internal Medicine | Admitting: Internal Medicine

## 2023-01-24 DIAGNOSIS — N63 Unspecified lump in unspecified breast: Secondary | ICD-10-CM

## 2023-09-05 ENCOUNTER — Ambulatory Visit: Payer: Self-pay | Admitting: Physical Therapy

## 2023-09-05 ENCOUNTER — Ambulatory Visit: Attending: Obstetrics and Gynecology | Admitting: Physical Therapy

## 2024-01-23 ENCOUNTER — Other Ambulatory Visit: Payer: Self-pay | Admitting: Family Medicine

## 2024-01-23 DIAGNOSIS — Z1231 Encounter for screening mammogram for malignant neoplasm of breast: Secondary | ICD-10-CM

## 2024-02-14 ENCOUNTER — Other Ambulatory Visit: Payer: Self-pay | Admitting: Medical Genetics

## 2024-02-20 ENCOUNTER — Ambulatory Visit
Admission: RE | Admit: 2024-02-20 | Discharge: 2024-02-20 | Disposition: A | Discharge status: Home / Self Care | Source: Ambulatory Visit | Attending: Family Medicine | Admitting: Family Medicine

## 2024-02-20 DIAGNOSIS — Z1231 Encounter for screening mammogram for malignant neoplasm of breast: Secondary | ICD-10-CM

## 2024-03-04 ENCOUNTER — Other Ambulatory Visit: Payer: Self-pay | Admitting: Medical Genetics

## 2024-03-04 DIAGNOSIS — Z006 Encounter for examination for normal comparison and control in clinical research program: Secondary | ICD-10-CM
